# Patient Record
Sex: Male | Born: 1935 | Race: White | Hispanic: No | Marital: Married | Smoking: Former smoker
Health system: Southern US, Community
[De-identification: ages and names within clinical notes are randomized; demographics above are authoritative.]

## PROBLEM LIST (undated history)

## (undated) DIAGNOSIS — J449 Chronic obstructive pulmonary disease, unspecified: Secondary | ICD-10-CM

## (undated) HISTORY — PX: APPENDECTOMY: SHX54

---

## 2015-01-26 ENCOUNTER — Emergency Department (HOSPITAL_BASED_OUTPATIENT_CLINIC_OR_DEPARTMENT_OTHER): Payer: Medicare Other

## 2015-01-26 ENCOUNTER — Encounter (HOSPITAL_BASED_OUTPATIENT_CLINIC_OR_DEPARTMENT_OTHER): Payer: Self-pay | Admitting: Emergency Medicine

## 2015-01-26 DIAGNOSIS — A419 Sepsis, unspecified organism: Principal | ICD-10-CM | POA: Diagnosis present

## 2015-01-26 DIAGNOSIS — J449 Chronic obstructive pulmonary disease, unspecified: Secondary | ICD-10-CM | POA: Diagnosis present

## 2015-01-26 DIAGNOSIS — D751 Secondary polycythemia: Secondary | ICD-10-CM | POA: Diagnosis present

## 2015-01-26 DIAGNOSIS — Z9981 Dependence on supplemental oxygen: Secondary | ICD-10-CM

## 2015-01-26 DIAGNOSIS — Z66 Do not resuscitate: Secondary | ICD-10-CM | POA: Diagnosis present

## 2015-01-26 DIAGNOSIS — E871 Hypo-osmolality and hyponatremia: Secondary | ICD-10-CM | POA: Diagnosis present

## 2015-01-26 DIAGNOSIS — J189 Pneumonia, unspecified organism: Secondary | ICD-10-CM | POA: Diagnosis present

## 2015-01-26 DIAGNOSIS — J9601 Acute respiratory failure with hypoxia: Secondary | ICD-10-CM | POA: Diagnosis present

## 2015-01-26 DIAGNOSIS — R0602 Shortness of breath: Secondary | ICD-10-CM | POA: Diagnosis not present

## 2015-01-26 DIAGNOSIS — F17200 Nicotine dependence, unspecified, uncomplicated: Secondary | ICD-10-CM | POA: Diagnosis present

## 2015-01-26 MED ORDER — ALBUTEROL SULFATE (2.5 MG/3ML) 0.083% IN NEBU
2.5000 mg | INHALATION_SOLUTION | Freq: Once | RESPIRATORY_TRACT | Status: AC
  Administered 2015-01-27: 2.5 mg via RESPIRATORY_TRACT
  Filled 2015-01-26: qty 3

## 2015-01-26 MED ORDER — SODIUM CHLORIDE 0.9 % IV BOLUS (SEPSIS)
1000.0000 mL | Freq: Once | INTRAVENOUS | Status: AC
  Administered 2015-01-27: 1000 mL via INTRAVENOUS

## 2015-01-26 MED ORDER — IPRATROPIUM BROMIDE 0.02 % IN SOLN: 0.5000 mg | Freq: Once | RESPIRATORY_TRACT | Status: DC

## 2015-01-26 MED ORDER — ALBUTEROL SULFATE (2.5 MG/3ML) 0.083% IN NEBU: 5.0000 mg | INHALATION_SOLUTION | Freq: Once | RESPIRATORY_TRACT | Status: DC

## 2015-01-26 MED ORDER — IPRATROPIUM-ALBUTEROL 0.5-2.5 (3) MG/3ML IN SOLN
3.0000 mL | Freq: Once | RESPIRATORY_TRACT | Status: AC
  Administered 2015-01-27: 3 mL via RESPIRATORY_TRACT
  Filled 2015-01-26: qty 3

## 2015-01-26 NOTE — ED Notes (Signed)
Per family member the patient has not been to a Dr in about 50 years. The patient has been sharing O2 with his Wife to make it through to right now to come to the hospital.

## 2015-01-26 NOTE — ED Notes (Signed)
Patient states that he has been SOB x 4 days. Worse with exertion and worse tonight

## 2015-01-27 ENCOUNTER — Inpatient Hospital Stay (HOSPITAL_BASED_OUTPATIENT_CLINIC_OR_DEPARTMENT_OTHER)
Admission: EM | Admit: 2015-01-27 | Discharge: 2015-01-28 | DRG: 871 | Disposition: A | Discharge status: Home / Self Care | Payer: Medicare Other | Attending: Internal Medicine | Admitting: Internal Medicine

## 2015-01-27 ENCOUNTER — Encounter (HOSPITAL_COMMUNITY): Payer: Self-pay | Admitting: Internal Medicine

## 2015-01-27 ENCOUNTER — Inpatient Hospital Stay (HOSPITAL_COMMUNITY): Payer: Medicare Other

## 2015-01-27 DIAGNOSIS — R0600 Dyspnea, unspecified: Secondary | ICD-10-CM

## 2015-01-27 DIAGNOSIS — A419 Sepsis, unspecified organism: Secondary | ICD-10-CM | POA: Diagnosis present

## 2015-01-27 DIAGNOSIS — R0602 Shortness of breath: Secondary | ICD-10-CM | POA: Diagnosis present

## 2015-01-27 DIAGNOSIS — Z9981 Dependence on supplemental oxygen: Secondary | ICD-10-CM | POA: Diagnosis not present

## 2015-01-27 DIAGNOSIS — Z720 Tobacco use: Secondary | ICD-10-CM | POA: Diagnosis present

## 2015-01-27 DIAGNOSIS — J449 Chronic obstructive pulmonary disease, unspecified: Secondary | ICD-10-CM | POA: Diagnosis present

## 2015-01-27 DIAGNOSIS — J189 Pneumonia, unspecified organism: Secondary | ICD-10-CM

## 2015-01-27 DIAGNOSIS — E871 Hypo-osmolality and hyponatremia: Secondary | ICD-10-CM | POA: Diagnosis present

## 2015-01-27 DIAGNOSIS — D751 Secondary polycythemia: Secondary | ICD-10-CM | POA: Diagnosis present

## 2015-01-27 DIAGNOSIS — F17200 Nicotine dependence, unspecified, uncomplicated: Secondary | ICD-10-CM | POA: Diagnosis present

## 2015-01-27 DIAGNOSIS — J439 Emphysema, unspecified: Secondary | ICD-10-CM

## 2015-01-27 DIAGNOSIS — Z66 Do not resuscitate: Secondary | ICD-10-CM | POA: Diagnosis present

## 2015-01-27 DIAGNOSIS — J9601 Acute respiratory failure with hypoxia: Secondary | ICD-10-CM | POA: Diagnosis present

## 2015-01-27 DIAGNOSIS — J438 Other emphysema: Secondary | ICD-10-CM | POA: Diagnosis not present

## 2015-01-27 LAB — COMPREHENSIVE METABOLIC PANEL
ALT: 12 U/L — AB (ref 17–63)
ALT: 12 U/L — ABNORMAL LOW (ref 17–63)
AST: 16 U/L (ref 15–41)
AST: 25 U/L (ref 15–41)
Albumin: 2.6 g/dL — ABNORMAL LOW (ref 3.5–5.0)
Albumin: 3.7 g/dL (ref 3.5–5.0)
Alkaline Phosphatase: 46 U/L (ref 38–126)
Alkaline Phosphatase: 60 U/L (ref 38–126)
Anion gap: 11 (ref 5–15)
Anion gap: 8 (ref 5–15)
BUN: 17 mg/dL (ref 6–20)
BUN: 23 mg/dL — ABNORMAL HIGH (ref 6–20)
CHLORIDE: 95 mmol/L — AB (ref 101–111)
CHLORIDE: 97 mmol/L — AB (ref 101–111)
CO2: 29 mmol/L (ref 22–32)
CO2: 34 mmol/L — AB (ref 22–32)
CREATININE: 0.81 mg/dL (ref 0.61–1.24)
Calcium: 8.9 mg/dL (ref 8.9–10.3)
Calcium: 9.4 mg/dL (ref 8.9–10.3)
Creatinine, Ser: 1.03 mg/dL (ref 0.61–1.24)
Glucose, Bld: 119 mg/dL — ABNORMAL HIGH (ref 65–99)
Glucose, Bld: 151 mg/dL — ABNORMAL HIGH (ref 65–99)
POTASSIUM: 3.6 mmol/L (ref 3.5–5.1)
POTASSIUM: 4.7 mmol/L (ref 3.5–5.1)
SODIUM: 134 mmol/L — AB (ref 135–145)
SODIUM: 140 mmol/L (ref 135–145)
TOTAL PROTEIN: 5.2 g/dL — AB (ref 6.5–8.1)
Total Bilirubin: 0.7 mg/dL (ref 0.3–1.2)
Total Bilirubin: 0.8 mg/dL (ref 0.3–1.2)
Total Protein: 7.5 g/dL (ref 6.5–8.1)

## 2015-01-27 LAB — MRSA PCR SCREENING: MRSA by PCR: NEGATIVE

## 2015-01-27 LAB — INFLUENZA PANEL BY PCR (TYPE A & B)
H1N1FLUPCR: NOT DETECTED
INFLBPCR: NEGATIVE
Influenza A By PCR: NEGATIVE

## 2015-01-27 LAB — CBC WITH DIFFERENTIAL/PLATELET
BASOS ABS: 0 10*3/uL (ref 0.0–0.1)
BASOS ABS: 0 10*3/uL (ref 0.0–0.1)
Basophils Relative: 0 %
Basophils Relative: 0 %
EOS ABS: 0 10*3/uL (ref 0.0–0.7)
EOS ABS: 0 10*3/uL (ref 0.0–0.7)
EOS PCT: 0 %
EOS PCT: 0 %
HCT: 47.9 % (ref 39.0–52.0)
HCT: 57 % — ABNORMAL HIGH (ref 39.0–52.0)
Hemoglobin: 16.2 g/dL (ref 13.0–17.0)
Hemoglobin: 19.2 g/dL — ABNORMAL HIGH (ref 13.0–17.0)
LYMPHS PCT: 7 %
Lymphocytes Relative: 11 %
Lymphs Abs: 0.9 10*3/uL (ref 0.7–4.0)
Lymphs Abs: 1.1 10*3/uL (ref 0.7–4.0)
MCH: 30.9 pg (ref 26.0–34.0)
MCH: 31.6 pg (ref 26.0–34.0)
MCHC: 33.7 g/dL (ref 30.0–36.0)
MCHC: 33.8 g/dL (ref 30.0–36.0)
MCV: 91.6 fL (ref 78.0–100.0)
MCV: 93.4 fL (ref 78.0–100.0)
MONO ABS: 1.5 10*3/uL — AB (ref 0.1–1.0)
MONO ABS: 1.6 10*3/uL — AB (ref 0.1–1.0)
Monocytes Relative: 12 %
Monocytes Relative: 15 %
Neutro Abs: 10.8 10*3/uL — ABNORMAL HIGH (ref 1.7–7.7)
Neutro Abs: 7.9 10*3/uL — ABNORMAL HIGH (ref 1.7–7.7)
Neutrophils Relative %: 74 %
Neutrophils Relative %: 81 %
PLATELETS: 213 10*3/uL (ref 150–400)
PLATELETS: 238 10*3/uL (ref 150–400)
RBC: 5.13 MIL/uL (ref 4.22–5.81)
RBC: 6.22 MIL/uL — ABNORMAL HIGH (ref 4.22–5.81)
RDW: 12.4 % (ref 11.5–15.5)
RDW: 12.6 % (ref 11.5–15.5)
WBC: 10.6 10*3/uL — AB (ref 4.0–10.5)
WBC: 13.3 10*3/uL — AB (ref 4.0–10.5)

## 2015-01-27 LAB — I-STAT CG4 LACTIC ACID, ED
LACTIC ACID, VENOUS: 2.5 mmol/L — AB (ref 0.5–2.0)
LACTIC ACID, VENOUS: 3.46 mmol/L — AB (ref 0.5–2.0)

## 2015-01-27 LAB — HIV ANTIBODY (ROUTINE TESTING W REFLEX): HIV Screen 4th Generation wRfx: NONREACTIVE

## 2015-01-27 LAB — BRAIN NATRIURETIC PEPTIDE: B Natriuretic Peptide: 133.1 pg/mL — ABNORMAL HIGH (ref 0.0–100.0)

## 2015-01-27 LAB — BLOOD GAS, ARTERIAL
Acid-Base Excess: 5.3 mmol/L — ABNORMAL HIGH (ref 0.0–2.0)
Bicarbonate: 30.4 mEq/L — ABNORMAL HIGH (ref 20.0–24.0)
Drawn by: 42624
FIO2: 0.4
O2 Saturation: 91.3 %
PCO2 ART: 54.6 mmHg — AB (ref 35.0–45.0)
PH ART: 7.364 (ref 7.350–7.450)
Patient temperature: 98.6
TCO2: 32.1 mmol/L (ref 0–100)
pO2, Arterial: 63 mmHg — ABNORMAL LOW (ref 80.0–100.0)

## 2015-01-27 LAB — PROTIME-INR
INR: 1.33 (ref 0.00–1.49)
Prothrombin Time: 16.6 seconds — ABNORMAL HIGH (ref 11.6–15.2)

## 2015-01-27 LAB — URINE MICROSCOPIC-ADD ON

## 2015-01-27 LAB — TROPONIN I: TROPONIN I: 0.04 ng/mL — AB (ref <0.031)

## 2015-01-27 LAB — URINALYSIS, ROUTINE W REFLEX MICROSCOPIC
BILIRUBIN URINE: NEGATIVE
Glucose, UA: NEGATIVE mg/dL
KETONES UR: NEGATIVE mg/dL
Leukocytes, UA: NEGATIVE
Nitrite: NEGATIVE
PH: 5.5 (ref 5.0–8.0)
Protein, ur: 100 mg/dL — AB
Specific Gravity, Urine: 1.026 (ref 1.005–1.030)
Urobilinogen, UA: 1 mg/dL (ref 0.0–1.0)

## 2015-01-27 LAB — APTT: aPTT: 40 seconds — ABNORMAL HIGH (ref 24–37)

## 2015-01-27 LAB — PROCALCITONIN: Procalcitonin: 0.22 ng/mL

## 2015-01-27 LAB — LACTIC ACID, PLASMA
LACTIC ACID, VENOUS: 1.2 mmol/L (ref 0.5–2.0)
Lactic Acid, Venous: 1.1 mmol/L (ref 0.5–2.0)

## 2015-01-27 LAB — STREP PNEUMONIAE URINARY ANTIGEN: STREP PNEUMO URINARY ANTIGEN: POSITIVE — AB

## 2015-01-27 MED ORDER — ALBUTEROL SULFATE (2.5 MG/3ML) 0.083% IN NEBU: 2.5000 mg | INHALATION_SOLUTION | RESPIRATORY_TRACT | Status: DC | PRN

## 2015-01-27 MED ORDER — ONDANSETRON HCL 4 MG PO TABS: 4.0000 mg | ORAL_TABLET | Freq: Four times a day (QID) | ORAL | Status: DC | PRN

## 2015-01-27 MED ORDER — ENOXAPARIN SODIUM 40 MG/0.4ML ~~LOC~~ SOLN
40.0000 mg | SUBCUTANEOUS | Status: DC
  Administered 2015-01-27 – 2015-01-28 (×2): 40 mg via SUBCUTANEOUS
  Filled 2015-01-27 (×2): qty 0.4

## 2015-01-27 MED ORDER — ALBUTEROL SULFATE (2.5 MG/3ML) 0.083% IN NEBU: 2.5000 mg | INHALATION_SOLUTION | Freq: Four times a day (QID) | RESPIRATORY_TRACT | Status: DC

## 2015-01-27 MED ORDER — INFLUENZA VAC SPLIT QUAD 0.5 ML IM SUSY
0.5000 mL | PREFILLED_SYRINGE | INTRAMUSCULAR | Status: AC
  Administered 2015-01-28: 0.5 mL via INTRAMUSCULAR

## 2015-01-27 MED ORDER — CETYLPYRIDINIUM CHLORIDE 0.05 % MT LIQD
7.0000 mL | Freq: Two times a day (BID) | OROMUCOSAL | Status: DC
  Administered 2015-01-27 – 2015-01-28 (×2): 7 mL via OROMUCOSAL

## 2015-01-27 MED ORDER — IPRATROPIUM-ALBUTEROL 0.5-2.5 (3) MG/3ML IN SOLN
3.0000 mL | Freq: Four times a day (QID) | RESPIRATORY_TRACT | Status: DC
  Administered 2015-01-27 – 2015-01-28 (×6): 3 mL via RESPIRATORY_TRACT
  Filled 2015-01-27 (×6): qty 3

## 2015-01-27 MED ORDER — SODIUM CHLORIDE 0.9 % IV SOLN
INTRAVENOUS | Status: AC
  Administered 2015-01-27 (×2): via INTRAVENOUS

## 2015-01-27 MED ORDER — IPRATROPIUM BROMIDE 0.02 % IN SOLN: 0.5000 mg | Freq: Four times a day (QID) | RESPIRATORY_TRACT | Status: DC

## 2015-01-27 MED ORDER — AZITHROMYCIN 500 MG IV SOLR
500.0000 mg | Freq: Once | INTRAVENOUS | Status: AC
  Administered 2015-01-27: 500 mg via INTRAVENOUS

## 2015-01-27 MED ORDER — PNEUMOCOCCAL VAC POLYVALENT 25 MCG/0.5ML IJ INJ
0.5000 mL | INJECTION | INTRAMUSCULAR | Status: AC
  Administered 2015-01-28: 0.5 mL via INTRAMUSCULAR
  Filled 2015-01-27: qty 0.5

## 2015-01-27 MED ORDER — DEXTROSE 5 % IV SOLN
500.0000 mg | INTRAVENOUS | Status: DC
  Administered 2015-01-27: 500 mg via INTRAVENOUS
  Filled 2015-01-27 (×2): qty 500

## 2015-01-27 MED ORDER — ACETAMINOPHEN 325 MG PO TABS: 650.0000 mg | ORAL_TABLET | Freq: Four times a day (QID) | ORAL | Status: DC | PRN

## 2015-01-27 MED ORDER — AZITHROMYCIN 500 MG IV SOLR
INTRAVENOUS | Status: AC
  Filled 2015-01-27: qty 500

## 2015-01-27 MED ORDER — ONDANSETRON HCL 4 MG/2ML IJ SOLN: 4.0000 mg | Freq: Four times a day (QID) | INTRAMUSCULAR | Status: DC | PRN

## 2015-01-27 MED ORDER — CEFTRIAXONE SODIUM 1 G IJ SOLR
1.0000 g | Freq: Once | INTRAMUSCULAR | Status: AC
  Administered 2015-01-27: 1 g via INTRAVENOUS

## 2015-01-27 MED ORDER — CEFTRIAXONE SODIUM 1 G IJ SOLR
INTRAMUSCULAR | Status: AC
  Filled 2015-01-27: qty 10

## 2015-01-27 MED ORDER — SODIUM CHLORIDE 0.9 % IV SOLN
Freq: Once | INTRAVENOUS | Status: AC
  Administered 2015-01-27: 03:00:00 via INTRAVENOUS

## 2015-01-27 MED ORDER — DEXTROSE 5 % IV SOLN: 500.0000 mg | INTRAVENOUS | Status: DC

## 2015-01-27 MED ORDER — DEXTROSE 5 % IV SOLN: 1.0000 g | INTRAVENOUS | Status: DC

## 2015-01-27 MED ORDER — DEXTROSE 5 % IV SOLN
1.0000 g | INTRAVENOUS | Status: DC
  Administered 2015-01-27: 1 g via INTRAVENOUS
  Filled 2015-01-27 (×2): qty 10

## 2015-01-27 MED ORDER — ENSURE ENLIVE PO LIQD
237.0000 mL | Freq: Two times a day (BID) | ORAL | Status: DC
  Administered 2015-01-27: 237 mL via ORAL

## 2015-01-27 MED ORDER — ACETAMINOPHEN 650 MG RE SUPP: 650.0000 mg | Freq: Four times a day (QID) | RECTAL | Status: DC | PRN

## 2015-01-27 MED ORDER — SODIUM CHLORIDE 0.9 % IV BOLUS (SEPSIS)
1000.0000 mL | INTRAVENOUS | Status: DC
  Administered 2015-01-27: 1000 mL via INTRAVENOUS

## 2015-01-27 NOTE — Progress Notes (Signed)
[facility] Progress Note   Jeremy Brewer  ZOX:[id number]  DOB: 1930-08-15  DOA: 01/27/2015 PCP: No primary care provider on file.  Brief narrative: Jeremy Brewer is a 81 y.o. male who is a smoker and is on oxygen at home. He states that he does not see a primary care physician in many years ("does not believe in doctors") and also tells me that he has been on oxygen for a number of years. He presents to the hospital for increased shortness of breath over the past 3-4 days.   Subjective: Minutes to a cough which is worse than usual and productive of white sputum. Is not short of breath while at rest sitting up in the bed. No complaint of fever or chills or chest pain. No nausea vomiting or dysuria.   Assessment/Plan: Principal Problem:   Acute respiratory failure with hypoxia  -likely secondary to COPD however, acute bronchitis cannot be completely excluded -chest x-ray reveals hyperinflation, emphysematous changes (fibrosis) but no clear-cut infiltrate -Influenza panel is negative -Exam reveals coarse right lower lobe crackles and strep pneumoniae urinary antigen is positive - treatment for lobar pneumonia has been started with Rocephin and Zithromax -He is still quite hypoxic and requiring a Venturi mask to keep his oxygen saturation at 90% -Continue nebulizer treatments-no indication for steroids at this time -Team to follow in the step down unit and wean oxygen as able  Active Problems: Sepsis  -Secondary to above  Hyponatremia -Possibly related to dehydration-continue slow IV fluids  Elevated troponin -Likely secondary to acute respiratory failure will continue to follow-she has no complaints of chest pain  COPD -Currently without exacerbation-he states that he is on oxygen at home but is unable to tell me how many liters he requires  Polycythemia -Likely secondary to above  Tobacco abuse -Have counseled him to discontinue    Code Status:     Code Status  Orders        Start     Ordered   01/27/15 0526  Do not attempt resuscitation (DNR)   Continuous    Question Answer Comment  In the event of cardiac or respiratory ARREST Do not call a "code blue"   In the event of cardiac or respiratory ARREST Do not perform Intubation, CPR, defibrillation or ACLS   In the event of cardiac or respiratory ARREST Use medication by any route, position, wound care, and other measures to relive pain and suffering. May use oxygen, suction and manual treatment of airway obstruction as needed for comfort.      01/27/15 0526     Family Communication:  Disposition Plan: follow in step down unit DVT prophylaxis: Lovenox Consultants: Procedures:  Antibiotics: Anti-infectives    Start     Dose/Rate Route Frequency Ordered Stop   01/27/15 2200  azithromycin (ZITHROMAX) 500 mg in dextrose 5 % 250 mL IVPB     500 mg 250 mL/hr over 60 Minutes Intravenous Every 24 hours 01/27/15 0348 02/02/15 2159   01/27/15 2200  cefTRIAXone (ROCEPHIN) 1 g in dextrose 5 % 50 mL IVPB     1 g 100 mL/hr over 30 Minutes Intravenous Every 24 hours 01/27/15 0348 02/02/15 2159   01/27/15 0530  cefTRIAXone (ROCEPHIN) 1 g in dextrose 5 % 50 mL IVPB  Status:  Discontinued     1 g 100 mL/hr over 30 Minutes Intravenous Every 24 hours 01/27/15 0526 01/27/15 0530   01/27/15 0530  azithromycin (ZITHROMAX) 500 mg in dextrose 5 % 250 mL IVPB  Status:  Discontinued     500 mg 250 mL/hr over 60 Minutes Intravenous Every 24 hours 01/27/15 0526 01/27/15 0530   01/27/15 0102  azithromycin (ZITHROMAX) 500 MG injection    Comments:  Corliss Parish   : cabinet override      01/27/15 0102 01/27/15 0246   01/27/15 0102  cefTRIAXone (ROCEPHIN) 1 G injection    Comments:  Corliss Parish   : cabinet override      01/27/15 0102 01/27/15 0246   01/27/15 0100  cefTRIAXone (ROCEPHIN) 1 g in dextrose 5 % 50 mL IVPB     1 g 100 mL/hr over 30 Minutes Intravenous  Once 01/27/15 0050 01/27/15 0140   01/27/15 0100   azithromycin (ZITHROMAX) 500 mg in dextrose 5 % 250 mL IVPB     500 mg 250 mL/hr over 60 Minutes Intravenous  Once 01/27/15 0050 01/27/15 0221      Objective: Filed Weights   01/26/15 2336 01/27/15 0301  Weight: 54.432 kg (120 lb) 57.5 kg (126 lb 12.2 oz)    Intake/Output Summary (Last 24 hours) at 01/27/15 1309 Last data filed at 01/27/15 1241  Gross per 24 hour  Intake    375 ml  Output    150 ml  Net    225 ml     Vitals Filed Vitals:   01/27/15 0844 01/27/15 0852 01/27/15 0900 01/27/15 1241  BP: 154/66 154/66 173/69 150/53  Pulse: 63 67 96 87  Temp:  98.1 F (36.7 C)  98.1 F (36.7 C)  TempSrc:  Oral  Oral  Resp: 12 21 29  35  Height:      Weight:      SpO2: 92% 93% 85% 90%    Exam:  General:  Pt is alert, not in acute distress  HEENT: No icterus, No thrush, oral mucosa moist  Cardiovascular: regular rate and rhythm, S1/S2 No murmur  Respiratory: Coarse crackles in left lower lobe-no wheezing or rhonchi  Abdomen: Soft, +Bowel sounds, non tender, non distended, no guarding  MSK: No LE edema, cyanosis or clubbing  Data Reviewed: Basic Metabolic Panel:  Recent Labs Lab 01/27/15 0005 01/27/15 0550  NA 140 134*  K 4.7 3.6  CL 95* 97*  CO2 34* 29  GLUCOSE 151* 119*  BUN 23* 17  CREATININE 1.03 0.81  CALCIUM 9.4 8.9   Liver Function Tests:  Recent Labs Lab 01/27/15 0005 01/27/15 0550  AST 25 16  ALT 12* 12*  ALKPHOS 60 46  BILITOT 0.7 0.8  PROT 7.5 5.2*  ALBUMIN 3.7 2.6*   No results for input(s): LIPASE, AMYLASE in the last 168 hours. No results for input(s): AMMONIA in the last 168 hours. CBC:  Recent Labs Lab 01/27/15 0005 01/27/15 0550  WBC 13.3* 10.6*  NEUTROABS 10.8* 7.9*  HGB 19.2* 16.2  HCT 57.0* 47.9  MCV 91.6 93.4  PLT 238 213   Cardiac Enzymes:  Recent Labs Lab 01/27/15 0550  TROPONINI 0.04*   BNP (last 3 results)  Recent Labs  01/27/15 0005  BNP 133.1*    ProBNP (last 3 results) No results for  input(s): PROBNP in the last 8760 hours.  CBG: No results for input(s): GLUCAP in the last 168 hours.  Recent Results (from the past 240 hour(s))  MRSA PCR Screening     Status: None   Collection Time: 01/27/15  3:05 AM  Result Value Ref Range Status   MRSA by PCR NEGATIVE NEGATIVE Final    Comment:  The GeneXpert MRSA Assay (FDA approved for NASAL specimens only), is one component of a comprehensive MRSA colonization surveillance program. It is not intended to diagnose MRSA infection nor to guide or monitor treatment for MRSA infections.      Studies: Dg Chest Port 1 View  01/27/2015  CLINICAL DATA:  Dyspnea, 4 days duration. EXAM: PORTABLE CHEST 1 VIEW COMPARISON:  None. FINDINGS: There is hyperinflation. Extensive emphysematous and fibrotic appearing changes are present. No confluent airspace consolidation. No large effusion. No pneumothorax. Hilar and mediastinal contours are unremarkable. IMPRESSION: Hyperinflation with emphysematous changes. The extensive interstitial coarsening is probably fibrotic, but a component of reversible interstitial infiltrate or edema cannot be excluded. No confluent consolidation or large effusion. Electronically Signed   By: Ellery Plunk M.D.   On: 01/27/2015 00:42    Scheduled Meds:  Scheduled Meds: . antiseptic oral rinse  7 mL Mouth Rinse BID  . azithromycin  500 mg Intravenous Q24H  . cefTRIAXone (ROCEPHIN)  IV  1 g Intravenous Q24H  . enoxaparin (LOVENOX) injection  40 mg Subcutaneous Q24H  . feeding supplement (ENSURE ENLIVE)  237 mL Oral BID BM  . [START ON 01/28/2015] Influenza vac split quadrivalent PF  0.5 mL Intramuscular Tomorrow-1000  . ipratropium-albuterol  3 mL Nebulization Q6H  . [START ON 01/28/2015] pneumococcal 23 valent vaccine  0.5 mL Intramuscular Tomorrow-1000   Continuous Infusions: . sodium chloride 125 mL/hr at 01/27/15 0609    Time spent on care of this patient: 35 min   Zyden Suman,  MD 01/27/2015, 1:09 PM  LOS: 0 days   [facility] Office  [phone] Pager - Text Page per [email or web address] If 7PM-7AM, please contact night-coverage [email or web address]

## 2015-01-27 NOTE — Progress Notes (Signed)
Initial Nutrition Assessment  DOCUMENTATION CODES:   Severe malnutrition in context of chronic illness  INTERVENTION:   Ensure Enlive po BID, each supplement provides 350 kcal and 20 grams of protein    NUTRITION DIAGNOSIS:   Malnutrition related to inability to eat as evidenced by severe depletion of body fat, severe depletion of muscle mass.  GOAL:   Patient will meet greater than or equal to 90% of their needs  MONITOR:   PO intake, Supplement acceptance, I & O's, Labs  REASON FOR ASSESSMENT:   Malnutrition Screening Tool    ASSESSMENT:   Jeremy Brewer is a 81 y.o. male with no significant past medical history presents to the ER because of worsening shortness of breath over the last 3-4 days. Patient also had been having subjective feeling of fever chills and productive cough. Patient's shortness of breath increases on walking. Denies any chest pain. In the ER patient was found to be mildly hypotensive and lactic acid elevated. Patient was given empiric antibiotics after blood cultures for pneumonia. Patient has been admitted for sepsis secondary to pneumonia. Patient otherwise denies any chest pain nausea vomiting abdominal pain or diarrhea. Patient has not been to a physician for many years.   Was unable to wake patient during time of visit. Pt has no PO intake on file, when RD visited, pt had not touched lunch tray brought to him. Ensure had a straw in it but was still full.  Pt exhibits severe malnutrition based upon severe muscle wasting and severe fat depletion.  Per chart review, pt reports weight loss, was unable to confirm at this time.  Likely cause of wt loss/malnutrition is shortness of breath related to COPD and Pneumonia   Diet Order:  Diet regular Room service appropriate?: Yes; Fluid consistency:: Thin  Skin:  Reviewed, no issues  Last BM:  01/26/2015  Height:   Ht Readings from Last 1 Encounters:  01/27/15 5\' 7"  (1.702 m)    Weight:   Wt  Readings from Last 1 Encounters:  01/27/15 126 lb 12.2 oz (57.5 kg)    Ideal Body Weight:  67.27 kg  BMI:  Body mass index is 19.85 kg/(m^2).  Estimated Nutritional Needs:   Kcal:  1700-2000  Protein:  60-75 grams  Fluid:  >/= 1.7L  EDUCATION NEEDS:   No education needs identified at this time  Dionne Ano. Penney Domanski, MS, RD LDN After Hours/Weekend Pager [phone]

## 2015-01-27 NOTE — H&P (Signed)
[facility] History and Physical  Jeremy Brewer WJX:[id number] DOB: June 30, 1930 DOA: 01/27/2015  Referring physician: Dr. Anitra Lauth. Patient was transferred from [facility] [place]. PCP: No primary care provider on file. patient has not been following with any PCP. Specialists: None.  Chief Complaint: Shortness of breath.  HPI: Jeremy Brewer is a 81 y.o. male with no significant past medical history presents to the ER because of worsening shortness of breath over the last 3-4 days. Patient also had been having subjective feeling of fever chills and productive cough. Patient's shortness of breath increases on walking. Denies any chest pain. In the ER patient was found to be mildly hypotensive and lactic acid elevated. Patient was given empiric antibiotics after blood cultures for pneumonia. Patient has been admitted for sepsis secondary to pneumonia. Patient otherwise denies any chest pain nausea vomiting abdominal pain or diarrhea. Patient has not been to a physician for many years.   Review of Systems: As presented in the history of presenting illness, rest negative.  Past Medical History  Diagnosis Date  . Medical history non-contributory    Past Surgical History  Procedure Laterality Date  . Appendectomy     Social History:  reports that he has been smoking.  He does not have any smokeless tobacco history on file. He reports that he does not drink alcohol. His drug history is not on file. Where does patient live home. Can patient participate in ADLs? Yes.  No Known Allergies  Family History:  Family History  Problem Relation Age of Onset  . Diabetes Mellitus II Neg Hx   . CAD Neg Hx       Prior to Admission medications   Not on File    Physical Exam: Filed Vitals:   01/27/15 0153 01/27/15 0200 01/27/15 0301 01/27/15 0318  BP: 129/58 129/62  131/63  Pulse: 89 85  76  Temp:  98.2 F (36.8 C) 97.4 F (36.3 C)   TempSrc:   Axillary   Resp: Height:   5'  7" (1.702 m)   Weight:   57.5 kg (126 lb 12.2 oz)   SpO2: 91% 89%  92%     General:  Moderately built and poorly nourished.  Eyes: Anicteric no pallor.  ENT: No discharge from the ears eyes nose and mouth.  Neck: No mass felt. No JVD appreciated.  Cardiovascular: S1-S2 heard.  Respiratory: No rhonchi or crepitations.   Abdomen: Soft nontender bowel sounds present.  Skin: No rash.  Musculoskeletal: No edema.  Psychiatric: Appears normal.  Neurologic: Alert awake oriented to time place and person. Moves all extremity.  Labs on Admission:  Basic Metabolic Panel:  Recent Labs Lab 01/27/15 0005  NA 140  K 4.7  CL 95*  CO2 34*  GLUCOSE 151*  BUN 23*  CREATININE 1.03  CALCIUM 9.4   Liver Function Tests:  Recent Labs Lab 01/27/15 0005  AST 25  ALT 12*  ALKPHOS 60  BILITOT 0.7  PROT 7.5  ALBUMIN 3.7   No results for input(s): LIPASE, AMYLASE in the last 168 hours. No results for input(s): AMMONIA in the last 168 hours. CBC:  Recent Labs Lab 01/27/15 0005  WBC 13.3*  NEUTROABS 10.8*  HGB 19.2*  HCT 57.0*  MCV 91.6  PLT 238   Cardiac Enzymes: No results for input(s): CKTOTAL, CKMB, CKMBINDEX, TROPONINI in the last 168 hours.  BNP (last 3 results)  Recent Labs  01/27/15 0005  BNP 133.1*    ProBNP (  last 3 results) No results for input(s): PROBNP in the last 8760 hours.  CBG: No results for input(s): GLUCAP in the last 168 hours.  Radiological Exams on Admission: Dg Chest Port 1 View  01/27/2015  CLINICAL DATA:  Dyspnea, 4 days duration. EXAM: PORTABLE CHEST 1 VIEW COMPARISON:  None. FINDINGS: There is hyperinflation. Extensive emphysematous and fibrotic appearing changes are present. No confluent airspace consolidation. No large effusion. No pneumothorax. Hilar and mediastinal contours are unremarkable. IMPRESSION: Hyperinflation with emphysematous changes. The extensive interstitial coarsening is probably fibrotic, but a component of  reversible interstitial infiltrate or edema cannot be excluded. No confluent consolidation or large effusion. Electronically Signed   By: Ellery Plunk M.D.   On: 01/27/2015 00:42    EKG: Independently reviewed. Normal sinus rhythm with first-degree AV block.  Assessment/Plan Principal Problem:   Acute respiratory failure with hypoxia (HCC) Active Problems:   Sepsis (HCC)   Community acquired pneumonia   COPD (chronic obstructive pulmonary disease) (HCC)   Polycythemia   Tobacco abuse   1. Acute respiratory failure with hypoxia - probably second to pneumonia and COPD. Patient has been tried on ceftriaxone and Zithromax for community-acquired pneumonia. Check urine Legionella and strep antigen and HIV status and influenza PCR. Patient has been also placed on nebulizer for COPD. Since patient has exertional symptoms I have ordered a 2-D echo. 2. Sepsis secondary to pneumonia - patient has been placed on sepsis protocol continue with aggressive hydration repeat lactic acid and check pro-calcitonin levels. Patient is on empiric antibiotics for adequate pneumonia. Follow blood cultures. 3. Polycythemia - already secondary to COPD. Follow CBC. 4. Hyperglycemia - check hemoglobin A1c. 5. Tobacco abuse - advised to quit smoking.  I have personally reviewed patient's chest x-ray and EKG.   DVT Prophylaxis Lovenox.  Code Status: DO NOT RESUSCITATE.  Family Communication: Discussed with patient.  Disposition Plan: Admit to inpatient.    Caelie Remsburg N. [facility] Pager [phone].  If 7PM-7AM, please contact night-coverage [email or web address] Password [facility] 01/27/2015, 5:28 AM

## 2015-01-27 NOTE — ED Notes (Addendum)
Pt transferred to [facility] via [facility] prior to last fluid infusion starting

## 2015-01-27 NOTE — ED Provider Notes (Addendum)
CSN: [id number]     Arrival date & time 01/26/15  2328 History   First MD Initiated Contact with Patient 01/26/15 2358     Chief Complaint  Patient presents with  . Shortness of Breath     (Consider location/radiation/quality/duration/timing/severity/associated sxs/prior Treatment) HPI Comments: patient is an 81 year old male with no medical history because he has not seen a doctor in the last 50 years who presents today with a four-day history of fever, chills, shortness of breath and productive cough. He has smoked for years and always has a cough however now he is coughing up thick green foul-smelling sputum. He also has had a lack of appetite but denies any vomiting. No mental status changes per family. They state he was very resistant to come to the doctor but finally today he was having so much trouble breathing they forced him to come. Patient's wife had oxygen which he had been using at home to help make it through but he was not getting better. He has had no recent hospitalizations takes no medications and uses no inhalers.    Patient is a 81 y.o. male presenting with shortness of breath. The history is provided by the patient and a relative.  Shortness of Breath Severity:  Severe Onset quality:  Gradual Duration:  4 days Timing:  Constant Progression:  Worsening Chronicity:  New Context: URI   Associated symptoms: cough and fever   Associated symptoms: no abdominal pain, no chest pain and no vomiting     History reviewed. No pertinent past medical history. History reviewed. No pertinent past surgical history. History reviewed. No pertinent family history. Social History  Substance Use Topics  . Smoking status: Heavy Tobacco Smoker  . Smokeless tobacco: None  . Alcohol Use: None    Review of Systems  Constitutional: Positive for fever, appetite change and fatigue.  Respiratory: Positive for cough and shortness of breath.   Cardiovascular: Negative for chest pain and  leg swelling.  Gastrointestinal: Negative for nausea, vomiting and abdominal pain.  All other systems reviewed and are negative.     Allergies  Review of patient's allergies indicates no known allergies.  Home Medications   Prior to Admission medications   Not on File   BP 100/81 mmHg  Pulse 92  Temp(Src) 97.8 F (36.6 C) (Oral)  Resp 15  Wt 120 lb (54.432 kg)  SpO2 94% Physical Exam  Constitutional: He is oriented to person, place, and time. He appears well-developed. He appears cachectic. He appears distressed.  Appears SOB  HENT:  Head: Normocephalic and atraumatic.  Mouth/Throat: Oropharynx is clear and moist. Mucous membranes are dry.  Eyes: Conjunctivae and EOM are normal. Pupils are equal, round, and reactive to light.  Neck: Normal range of motion. Neck supple.  Cardiovascular: Regular rhythm and intact distal pulses.  Tachycardia present.   No murmur heard. Pulmonary/Chest: Accessory muscle usage present. Tachypnea noted. No respiratory distress. He has decreased breath sounds. He has wheezes. He has no rales.  Abdominal: Soft. He exhibits no distension. There is no tenderness. There is no rebound and no guarding.  Musculoskeletal: Normal range of motion. He exhibits no edema or tenderness.  Neurological: He is alert and oriented to person, place, and time.  Skin: Skin is warm and dry. Rash noted. No erythema.  Areas of eczema over the chest, neck and upper ext  Psychiatric: He has a normal mood and affect. His behavior is normal.  Nursing note and vitals reviewed.   ED Course  Procedures (including critical care time) Labs Review Labs Reviewed  CBC WITH DIFFERENTIAL/PLATELET - Abnormal; Notable for the following:    WBC 13.3 (*)    RBC 6.22 (*)    Hemoglobin 19.2 (*)    HCT 57.0 (*)    Neutro Abs 10.8 (*)    Monocytes Absolute 1.6 (*)    All other components within normal limits  COMPREHENSIVE METABOLIC PANEL - Abnormal; Notable for the following:     Chloride 95 (*)    CO2 34 (*)    Glucose, Bld 151 (*)    BUN 23 (*)    ALT 12 (*)    All other components within normal limits  BRAIN NATRIURETIC PEPTIDE - Abnormal; Notable for the following:    B Natriuretic Peptide 133.1 (*)    All other components within normal limits  I-STAT CG4 LACTIC ACID, ED - Abnormal; Notable for the following:    Lactic Acid, Venous 3.46 (*)    All other components within normal limits  CULTURE, BLOOD (ROUTINE X 2)  CULTURE, BLOOD (ROUTINE X 2)  URINE CULTURE  URINALYSIS, ROUTINE W REFLEX MICROSCOPIC (NOT AT [facility])  I-STAT CG4 LACTIC ACID, ED  I-STAT CG4 LACTIC ACID, ED    Imaging Review Dg Chest Port 1 View  01/27/2015  CLINICAL DATA:  Dyspnea, 4 days duration. EXAM: PORTABLE CHEST 1 VIEW COMPARISON:  None. FINDINGS: There is hyperinflation. Extensive emphysematous and fibrotic appearing changes are present. No confluent airspace consolidation. No large effusion. No pneumothorax. Hilar and mediastinal contours are unremarkable. IMPRESSION: Hyperinflation with emphysematous changes. The extensive interstitial coarsening is probably fibrotic, but a component of reversible interstitial infiltrate or edema cannot be excluded. No confluent consolidation or large effusion. Electronically Signed   By: Ellery Plunk M.D.   On: 01/27/2015 00:42   I have personally reviewed and evaluated these images and lab results as part of my medical decision-making.   EKG Interpretation   Date/Time:  Tuesday January 26 2015 23:56:56 EDT Ventricular Rate:  94 PR Interval:  216 QRS Duration: 106 QT Interval:  360 QTC Calculation: 450 R Axis:   111 Text Interpretation:  Sinus rhythm with 1st degree A-V block Inferior  infarct , age undetermined Possible Anterolateral infarct , age  undetermined No previous tracing Confirmed by Anitra Lauth  MD, Aloysuis Ribaudo  [phone]) on 01/27/2015 12:30:32 AM      MDM   Final diagnoses:  Community acquired pneumonia  Sepsis, due to  unspecified organism [facility])    Patient is an 81 year old male with no known medical history because he has not seen a doctor in 50 years who presents today with concern for pneumonia. He was hypoxic to 85% on 4 L of nasal cannula oxygen upon arrival with accessory muscle use and wheezing. He had been having URI symptoms with fever for the last 4 days which are worsening. He smokes heavily but denies any alcohol use. He denies any chest pain or distal edema. No prior heart problems.  Code sepsis protocol initiated. Patient given albuterol and Atrovent with significant improvement in his shortness of breath. He is on 6 L and then toward a mask satting low 90s. He is awake alert and able to speak in full sentences at this time. He was started on Rocephin and azithromycin for community-acquired pneumonia.  Lactic acid is 3.46 with a leukocytosis of 13,000 and hemoglobin of 19. CMP with elevated CO2 of 34. BNP within normal limits. EKG without acute findings. Chest x-ray with concern for a reversible  interstitial infiltrate.  Patient will be admitted for further care.  CRITICAL CARE Performed by: Gwyneth Sprout Total critical care time: 30 minutes Critical care time was exclusive of separately billable procedures and treating other patients. Critical care was necessary to treat or prevent imminent or life-threatening deterioration. Critical care was time spent personally by me on the following activities: development of treatment plan with patient and/or surrogate as well as nursing, discussions with consultants, evaluation of patient's response to treatment, examination of patient, obtaining history from patient or surrogate, ordering and performing treatments and interventions, ordering and review of laboratory studies, ordering and review of radiographic studies, pulse oximetry and re-evaluation of patient's condition.   Gwyneth Sprout, MD 01/27/15 5366  Gwyneth Sprout, MD 01/27/15 4403

## 2015-01-27 NOTE — Progress Notes (Signed)
ANTIBIOTIC CONSULT NOTE - INITIAL  Pharmacy Consult for Ceftriaxone and Azithromycin Indication: CAP  No Known Allergies  Patient Measurements: Height: 5\' 7"  (170.2 cm) Weight: 126 lb 12.2 oz (57.5 kg) IBW/kg (Calculated) : 66.1  Vital Signs: Temp: 97.4 F (36.3 C) (11/02 0301) Temp Source: Axillary (11/02 0301) BP: 131/63 mmHg (11/02 0318) Pulse Rate: 76 (11/02 0318) Intake/Output from previous day:   Intake/Output from this shift:    Labs:  Recent Labs  01/27/15 0005  WBC 13.3*  HGB 19.2*  PLT 238  CREATININE 1.03   Estimated Creatinine Clearance: 45.7 mL/min (by C-G formula based on Cr of 1.03). No results for input(s): VANCOTROUGH, VANCOPEAK, VANCORANDOM, GENTTROUGH, GENTPEAK, GENTRANDOM, TOBRATROUGH, TOBRAPEAK, TOBRARND, AMIKACINPEAK, AMIKACINTROU, AMIKACIN in the last 72 hours.   Microbiology: No results found for this or any previous visit (from the past 720 hour(s)).  Medical History: History reviewed. No pertinent past medical history.  Medications:  No home medications  Assessment: 81 y.o. male presents to [facility] with. Pt received Azithromycin 500mg  IV and Rocephin 1gm IV ~0100. To continue abx for CAP.  Goal of Therapy:  Resolution of infection  Plan:  Azithromycin 500mg  IV q24h Ceftriaxone 1gm IV q24h Pharmacy will sign off - please reconsult if needed  Christoper Fabian, PharmD, BCPS Clinical pharmacist, pager [phone] 01/27/2015,3:45 AM

## 2015-01-27 NOTE — Progress Notes (Signed)
Utilization Review Completed.  

## 2015-01-27 NOTE — ED Notes (Addendum)
Attempted report 3 times, no answer from floor

## 2015-01-28 ENCOUNTER — Inpatient Hospital Stay (HOSPITAL_COMMUNITY): Payer: Medicare Other

## 2015-01-28 DIAGNOSIS — J438 Other emphysema: Secondary | ICD-10-CM

## 2015-01-28 DIAGNOSIS — A419 Sepsis, unspecified organism: Secondary | ICD-10-CM | POA: Diagnosis not present

## 2015-01-28 DIAGNOSIS — J189 Pneumonia, unspecified organism: Secondary | ICD-10-CM

## 2015-01-28 DIAGNOSIS — D751 Secondary polycythemia: Secondary | ICD-10-CM

## 2015-01-28 DIAGNOSIS — Z720 Tobacco use: Secondary | ICD-10-CM

## 2015-01-28 LAB — LEGIONELLA PNEUMOPHILA SEROGP 1 UR AG: L. pneumophila Serogp 1 Ur Ag: NEGATIVE

## 2015-01-28 LAB — BASIC METABOLIC PANEL
Anion gap: 8 (ref 5–15)
BUN: 12 mg/dL (ref 6–20)
CALCIUM: 8.9 mg/dL (ref 8.9–10.3)
CO2: 32 mmol/L (ref 22–32)
CREATININE: 0.76 mg/dL (ref 0.61–1.24)
Chloride: 103 mmol/L (ref 101–111)
GFR calc non Af Amer: >60 mL/min (ref >60)
Glucose, Bld: 94 mg/dL (ref 65–99)
Potassium: 4.1 mmol/L (ref 3.5–5.1)
SODIUM: 143 mmol/L (ref 135–145)

## 2015-01-28 LAB — CBC
HCT: 47.4 % (ref 39.0–52.0)
Hemoglobin: 15.7 g/dL (ref 13.0–17.0)
MCH: 31.3 pg (ref 26.0–34.0)
MCHC: 33.1 g/dL (ref 30.0–36.0)
MCV: 94.6 fL (ref 78.0–100.0)
PLATELETS: 253 10*3/uL (ref 150–400)
RBC: 5.01 MIL/uL (ref 4.22–5.81)
RDW: 12.7 % (ref 11.5–15.5)
WBC: 10.2 10*3/uL (ref 4.0–10.5)

## 2015-01-28 LAB — HEMOGLOBIN A1C
HEMOGLOBIN A1C: 5.7 % — AB (ref 4.8–5.6)
MEAN PLASMA GLUCOSE: 117 mg/dL

## 2015-01-28 LAB — URINE CULTURE: CULTURE: NO GROWTH

## 2015-01-28 LAB — TROPONIN I

## 2015-01-28 MED ORDER — TIOTROPIUM BROMIDE MONOHYDRATE 18 MCG IN CAPS: 18.0000 ug | ORAL_CAPSULE | Freq: Every day | RESPIRATORY_TRACT | Status: DC

## 2015-01-28 MED ORDER — FLUTICASONE-SALMETEROL 250-50 MCG/DOSE IN AEPB: 1.0000 | INHALATION_SPRAY | Freq: Two times a day (BID) | RESPIRATORY_TRACT | Status: DC

## 2015-01-28 MED ORDER — ALBUTEROL SULFATE HFA 108 (90 BASE) MCG/ACT IN AERS: 2.0000 | INHALATION_SPRAY | Freq: Four times a day (QID) | RESPIRATORY_TRACT | Status: DC | PRN

## 2015-01-28 MED ORDER — LEVOFLOXACIN 750 MG PO TABS: 750.0000 mg | ORAL_TABLET | Freq: Every day | ORAL | Status: DC

## 2015-01-28 NOTE — Progress Notes (Signed)
SATURATION QUALIFICATIONS: (This note is used to comply with regulatory documentation for home oxygen)  Patient Saturations on Room Air at Rest = 87%  Pt saturation on 2L of 02 was 92%    Please briefly explain why patient needs home oxygen:Hypoxia

## 2015-01-28 NOTE — Care Management Note (Signed)
Case Management Note  Patient Details  Name: Jeremy Brewer MRN: [id number] Date of Birth: May 18, 1930  Subjective/Objective:   Pt lives with spouse, states he has oxygen @ home and wife uses it periodically.  Also states he has no PCP and requests name of provider in [place].  TC to [facility] and determined Dr Betty [place] is accepting new patients.  TC to [facility] - provider is in network.  TC to spouse who states the home oxygen is hers and pt has never been on oxygen.  Discussed PCP for pt - she is familiar with group and will attempt to arrange appt for pt.  RN will ambulate pt to determine need for home O2.                            Expected Discharge Plan:  Home/Self Care  Discharge planning Services  CM Consult  Status of Service:  In process, will continue to follow  Magdalene River, RN 01/28/2015, 10:58 AM

## 2015-01-28 NOTE — Care Management Note (Signed)
Case Management Note  Patient Details  Name: Jeremy Brewer MRN: [id number] Date of Birth: 27-Apr-1930  Subjective/Objective:    Pt will need home O2 on discharge.  Wife reports her O2 is through [facility] and requests we order pt's through the same agency.  Wife also states pt has been "contrary" about establishing care with a physician but is hopeful that he will agree since MD is in [facility] and close to their home.                 Expected Discharge Plan:  Home/Self Care  Discharge planning Services  CM Consult   Post Acute Care Choice:  Durable Medical Equipment   DME Arranged:  Oxygen DME Agency:  [facility]  Status of Service:  Completed, signed off  Magdalene River, [place] 01/28/2015, 2:01 PM

## 2015-01-28 NOTE — Discharge Summary (Addendum)
Physician Discharge Summary  Townes Fuhs RUE:[id number] DOB: 1930/09/08 DOA: 01/27/2015  PCP: No primary care provider on file. He s attempting to find a PCP in [place]  Admit date: 01/27/2015 Discharge date: 01/28/2015  Time spent: 60 minutes  Recommendations for Outpatient Follow-up:  1. Needs baseline PFTs  Discharge Condition: stable  Discharge Diagnoses:  Principal Problem:   Acute respiratory failure with hypoxia (HCC) Active Problems:   Sepsis (HCC)   Community acquired pneumonia   COPD (chronic obstructive pulmonary disease) (HCC)   Polycythemia   Tobacco abuse   History of present illness:  Jeremy Brewer is a 81 y.o. male who is a smoker. He states that he does not see a primary care physician in many years ("does not believe in doctors") and initially told me that he has been on oxygen for a number of years but I later discovered that he has been using his wifes O2. He presents to the hospital for increased shortness of breath over the past 3-4 days.  Hospital Course:  Principal Problem:  Acute respiratory failure with hypoxia  -likely secondary to severe COPD and community acquired pneumonia -chest x-ray reveals hyperinflation, emphysematous changes (fibrosis) but no clear-cut infiltrate- However, exam reveals coarse left lower lobe crackles- strep pneumoniae urinary antigen is positive - treatment for lobar pneumonia has been started with Rocephin and Zithromax- will be transitioned to Levaquin on discharge to complete a 7 day course -Influenza panel is negative - he was initially requiring a Venti mask with an FiO2 of 50 % to maintain O2 saturations in mid 90s - now has been weaned down to 2 L of O2 at rest  Active Problems:  Sepsis  -Secondary community acquired pneumonia   COPD  - severe COPD at baseline- did not have wheezing and therefore was not started on IV steroids- his respiratory status has improved without steroids - will discharge with Advair, Spiriva  and Albuterol inhaler (new prescriptions) -Have prescribed O2 on discharge - recommend PFTs as outpt  Hyponatremia -Possibly related to dehydration- resolved with IV fluids  Elevated troponin -one mildly elevated troponin noted on day of admission- subsequent 3 troponin negative- he has had no complaints of chest pain-   Polycythemia? - Hb noted to be elevated on admission however, has improved with IV hydration and therefore was likely secondary to hemoconcenteration (see labs below)  Tobacco abuse -Have counseled him to discontinue   Discharge Exam: Filed Weights   01/26/15 2336 01/27/15 0301  Weight: 54.432 kg (120 lb) 57.5 kg (126 lb 12.2 oz)   Filed Vitals:   01/28/15 1000  BP: 162/61  Pulse: 59  Temp:   Resp: 23    General: AAO x 3, no distress Cardiovascular: RRR, no murmurs  Respiratory: mild crackles in LLL GI: soft, non-tender, non-distended, bowel sound positive  Discharge Instructions You were cared for by a hospitalist during your hospital stay. If you have any questions about your discharge medications or the care you received while you were in the hospital after you are discharged, you can call the unit and asked to speak with the hospitalist on call if the hospitalist that took care of you is not available. Once you are discharged, your primary care physician will handle any further medical issues. Please note that NO REFILLS for any discharge medications will be authorized once you are discharged, as it is imperative that you return to your primary care physician (or establish a relationship with a primary care physician if you do not  have one) for your aftercare needs so that they can reassess your need for medications and monitor your lab values.  Discharge Instructions    Diet - low sodium heart healthy    Complete by:  As directed      Increase activity slowly    Complete by:  As directed             Medication List    TAKE these medications         albuterol 108 (90 BASE) MCG/ACT inhaler  Commonly known as:  PROVENTIL HFA;VENTOLIN HFA  Inhale 2 puffs into the lungs every 6 (six) hours as needed for wheezing or shortness of breath.     Fluticasone-Salmeterol 250-50 MCG/DOSE Aepb  Commonly known as:  ADVAIR DISKUS  Inhale 1 puff into the lungs 2 (two) times daily.     levofloxacin 750 MG tablet  Commonly known as:  LEVAQUIN  Take 1 tablet (750 mg total) by mouth daily.     tiotropium 18 MCG inhalation capsule  Commonly known as:  SPIRIVA HANDIHALER  Place 1 capsule (18 mcg total) into inhaler and inhale daily.       No Known Allergies     Follow-up Information    Follow up with [place], Timoteo Expose, MD.   Specialty:  Family Medicine   Why:  Call for an appointment   Contact information:   [address] [place] [address] [phone]        The results of significant diagnostics from this hospitalization (including imaging, microbiology, ancillary and laboratory) are listed below for reference.    Significant Diagnostic Studies: Dg Chest Port 1 View  01/28/2015  CLINICAL DATA:  Pneumonia. EXAM: PORTABLE CHEST 1 VIEW COMPARISON:  01/26/2015. FINDINGS: Mediastinum and hilar structures are normal. Bibasilar infiltrates. No pleural effusion or pneumothorax. Heart size stable. No acute bony abnormality identified. IMPRESSION: Bibasilar infiltrates consistent with pneumonia. Findings have progressed from prior exam. Electronically Signed   By: Maisie Fus  Register   On: 01/28/2015 07:07   Dg Chest Port 1 View  01/27/2015  CLINICAL DATA:  Dyspnea, 4 days duration. EXAM: PORTABLE CHEST 1 VIEW COMPARISON:  None. FINDINGS: There is hyperinflation. Extensive emphysematous and fibrotic appearing changes are present. No confluent airspace consolidation. No large effusion. No pneumothorax. Hilar and mediastinal contours are unremarkable. IMPRESSION: Hyperinflation with emphysematous changes. The extensive interstitial coarsening  is probably fibrotic, but a component of reversible interstitial infiltrate or edema cannot be excluded. No confluent consolidation or large effusion. Electronically Signed   By: Ellery Plunk M.D.   On: 01/27/2015 00:42    Microbiology: Recent Results (from the past 240 hour(s))  MRSA PCR Screening     Status: None   Collection Time: 01/27/15  3:05 AM  Result Value Ref Range Status   MRSA by PCR NEGATIVE NEGATIVE Final    Comment:        The GeneXpert MRSA Assay (FDA approved for NASAL specimens only), is one component of a comprehensive MRSA colonization surveillance program. It is not intended to diagnose MRSA infection nor to guide or monitor treatment for MRSA infections.   Urine culture     Status: None   Collection Time: 01/27/15  3:35 AM  Result Value Ref Range Status   Specimen Description URINE, CLEAN CATCH  Final   Special Requests NONE  Final   Culture NO GROWTH 1 DAY  Final   Report Status 01/28/2015 FINAL  Final     Labs: Basic  Metabolic Panel:  Recent Labs Lab 01/27/15 0005 01/27/15 0550 01/28/15 0858  NA 140 134* 143  K 4.7 3.6 4.1  CL 95* 97* 103  CO2 34* 29 32  GLUCOSE 151* 119* 94  BUN 23* 17 12  CREATININE 1.03 0.81 0.76  CALCIUM 9.4 8.9 8.9   Liver Function Tests:  Recent Labs Lab 01/27/15 0005 01/27/15 0550  AST 25 16  ALT 12* 12*  ALKPHOS 60 46  BILITOT 0.7 0.8  PROT 7.5 5.2*  ALBUMIN 3.7 2.6*   No results for input(s): LIPASE, AMYLASE in the last 168 hours. No results for input(s): AMMONIA in the last 168 hours. CBC:  Recent Labs Lab 01/27/15 0005 01/27/15 0550 01/28/15 0858  WBC 13.3* 10.6* 10.2  NEUTROABS 10.8* 7.9*  --   HGB 19.2* 16.2 15.7  HCT 57.0* 47.9 47.4  MCV 91.6 93.4 94.6  PLT 238 213 253   Cardiac Enzymes:  Recent Labs Lab 01/27/15 0550 01/27/15 1450 01/27/15 1903 01/28/15 0050  TROPONINI 0.04* <0.03 <0.03 <0.03   BNP: BNP (last 3 results)  Recent Labs  01/27/15 0005  BNP 133.1*     ProBNP (last 3 results) No results for input(s): PROBNP in the last 8760 hours.  CBG: No results for input(s): GLUCAP in the last 168 hours.     SignedCalvert Cantor, MD [facility] 01/28/2015, 11:54 AM

## 2015-02-01 LAB — CULTURE, BLOOD (ROUTINE X 2)
CULTURE: NO GROWTH
Culture: NO GROWTH

## 2017-03-21 ENCOUNTER — Observation Stay (HOSPITAL_BASED_OUTPATIENT_CLINIC_OR_DEPARTMENT_OTHER)
Admission: EM | Admit: 2017-03-21 | Discharge: 2017-03-23 | Disposition: A | Discharge status: Home / Self Care | Payer: Medicare Other | Attending: Family Medicine | Admitting: Family Medicine

## 2017-03-21 ENCOUNTER — Encounter (HOSPITAL_BASED_OUTPATIENT_CLINIC_OR_DEPARTMENT_OTHER): Payer: Self-pay

## 2017-03-21 ENCOUNTER — Emergency Department (HOSPITAL_BASED_OUTPATIENT_CLINIC_OR_DEPARTMENT_OTHER): Payer: Medicare Other

## 2017-03-21 ENCOUNTER — Other Ambulatory Visit: Payer: Self-pay

## 2017-03-21 DIAGNOSIS — J9621 Acute and chronic respiratory failure with hypoxia: Secondary | ICD-10-CM | POA: Diagnosis not present

## 2017-03-21 DIAGNOSIS — J9601 Acute respiratory failure with hypoxia: Secondary | ICD-10-CM | POA: Diagnosis present

## 2017-03-21 DIAGNOSIS — J441 Chronic obstructive pulmonary disease with (acute) exacerbation: Secondary | ICD-10-CM | POA: Insufficient documentation

## 2017-03-21 DIAGNOSIS — E876 Hypokalemia: Secondary | ICD-10-CM | POA: Diagnosis not present

## 2017-03-21 DIAGNOSIS — J449 Chronic obstructive pulmonary disease, unspecified: Secondary | ICD-10-CM | POA: Diagnosis present

## 2017-03-21 DIAGNOSIS — Z79899 Other long term (current) drug therapy: Secondary | ICD-10-CM | POA: Diagnosis not present

## 2017-03-21 DIAGNOSIS — J9620 Acute and chronic respiratory failure, unspecified whether with hypoxia or hypercapnia: Secondary | ICD-10-CM | POA: Diagnosis present

## 2017-03-21 DIAGNOSIS — Z9981 Dependence on supplemental oxygen: Secondary | ICD-10-CM | POA: Diagnosis not present

## 2017-03-21 HISTORY — DX: Chronic obstructive pulmonary disease, unspecified: J44.9

## 2017-03-21 LAB — CBC WITH DIFFERENTIAL/PLATELET
Basophils Absolute: 0.1 10*3/uL (ref 0.0–0.1)
Basophils Relative: 0 %
Eosinophils Absolute: 0 10*3/uL (ref 0.0–0.7)
Eosinophils Relative: 0 %
HEMATOCRIT: 50.2 % (ref 39.0–52.0)
HEMOGLOBIN: 17 g/dL (ref 13.0–17.0)
LYMPHS ABS: 0.7 10*3/uL (ref 0.7–4.0)
Lymphocytes Relative: 3 %
MCH: 30 pg (ref 26.0–34.0)
MCHC: 33.9 g/dL (ref 30.0–36.0)
MCV: 88.5 fL (ref 78.0–100.0)
MONOS PCT: 6 %
Monocytes Absolute: 1.2 10*3/uL — ABNORMAL HIGH (ref 0.1–1.0)
NEUTROS ABS: 19.2 10*3/uL — AB (ref 1.7–7.7)
NEUTROS PCT: 91 %
Platelets: 219 10*3/uL (ref 150–400)
RBC: 5.67 MIL/uL (ref 4.22–5.81)
RDW: 12.6 % (ref 11.5–15.5)
WBC: 21.2 10*3/uL — ABNORMAL HIGH (ref 4.0–10.5)

## 2017-03-21 LAB — COMPREHENSIVE METABOLIC PANEL
ALK PHOS: 64 U/L (ref 38–126)
ALT: 18 U/L (ref 17–63)
ANION GAP: 13 (ref 5–15)
AST: 30 U/L (ref 15–41)
Albumin: 4.3 g/dL (ref 3.5–5.0)
BILIRUBIN TOTAL: 1.8 mg/dL — AB (ref 0.3–1.2)
BUN: 15 mg/dL (ref 6–20)
CO2: 28 mmol/L (ref 22–32)
Calcium: 9.2 mg/dL (ref 8.9–10.3)
Chloride: 95 mmol/L — ABNORMAL LOW (ref 101–111)
Creatinine, Ser: 1.11 mg/dL (ref 0.61–1.24)
GFR calc non Af Amer: 59 mL/min — ABNORMAL LOW (ref >60)
GLUCOSE: 131 mg/dL — AB (ref 65–99)
Potassium: 4 mmol/L (ref 3.5–5.1)
Sodium: 136 mmol/L (ref 135–145)
TOTAL PROTEIN: 7.4 g/dL (ref 6.5–8.1)

## 2017-03-21 LAB — TROPONIN I: Troponin I: 0.04 ng/mL (ref <0.03)

## 2017-03-21 LAB — I-STAT ARTERIAL BLOOD GAS, ED
Acid-Base Excess: 2 mmol/L (ref 0.0–2.0)
BICARBONATE: 28.4 mmol/L — AB (ref 20.0–28.0)
O2 Saturation: 94 %
PH ART: 7.389 (ref 7.350–7.450)
TCO2: 30 mmol/L (ref 22–32)
pCO2 arterial: 47 mmHg (ref 32.0–48.0)
pO2, Arterial: 72 mmHg — ABNORMAL LOW (ref 83.0–108.0)

## 2017-03-21 LAB — I-STAT CG4 LACTIC ACID, ED: LACTIC ACID, VENOUS: 1.69 mmol/L (ref 0.5–1.9)

## 2017-03-21 MED ORDER — AZITHROMYCIN 500 MG IV SOLR
INTRAVENOUS | Status: AC
  Filled 2017-03-21: qty 500

## 2017-03-21 MED ORDER — DEXTROSE 5 % IV SOLN
500.0000 mg | Freq: Once | INTRAVENOUS | Status: AC
  Administered 2017-03-21: 500 mg via INTRAVENOUS
  Filled 2017-03-21: qty 500

## 2017-03-21 MED ORDER — DEXTROSE 5 % IV SOLN
1.0000 g | INTRAVENOUS | Status: DC
  Administered 2017-03-22: 1 g via INTRAVENOUS
  Filled 2017-03-21 (×2): qty 10

## 2017-03-21 MED ORDER — DEXTROSE 5 % IV SOLN
1.0000 g | Freq: Once | INTRAVENOUS | Status: AC
  Administered 2017-03-21: 1 g via INTRAVENOUS
  Filled 2017-03-21: qty 10

## 2017-03-21 MED ORDER — SODIUM CHLORIDE 0.9 % IV BOLUS (SEPSIS)
250.0000 mL | Freq: Once | INTRAVENOUS | Status: AC
  Administered 2017-03-21: 250 mL via INTRAVENOUS

## 2017-03-21 MED ORDER — DEXTROSE 5 % IV SOLN
500.0000 mg | INTRAVENOUS | Status: DC
  Administered 2017-03-22: 500 mg via INTRAVENOUS
  Filled 2017-03-21 (×3): qty 500

## 2017-03-21 MED ORDER — SODIUM CHLORIDE 0.9 % IV BOLUS (SEPSIS)
1000.0000 mL | Freq: Once | INTRAVENOUS | Status: AC
  Administered 2017-03-21: 1000 mL via INTRAVENOUS

## 2017-03-21 MED ORDER — SODIUM CHLORIDE 0.9 % IV BOLUS (SEPSIS)
500.0000 mL | Freq: Once | INTRAVENOUS | Status: AC
  Administered 2017-03-21: 500 mL via INTRAVENOUS

## 2017-03-21 MED ORDER — IPRATROPIUM-ALBUTEROL 0.5-2.5 (3) MG/3ML IN SOLN
3.0000 mL | Freq: Four times a day (QID) | RESPIRATORY_TRACT | Status: DC
  Administered 2017-03-21 – 2017-03-22 (×2): 3 mL via RESPIRATORY_TRACT
  Filled 2017-03-21: qty 3

## 2017-03-21 NOTE — ED Notes (Signed)
Portable x-ray done

## 2017-03-21 NOTE — ED Triage Notes (Signed)
Per stepson pt with prod cough x 1 week-hx of chronic cough-presents to triage in w/c with O2 4L [place]

## 2017-03-21 NOTE — ED Provider Notes (Signed)
[facility] EMERGENCY DEPARTMENT Provider Note   CSN: [id number] Arrival date & time: 03/21/17  2110     History   Chief Complaint Chief Complaint  Patient presents with  . Shortness of Breath    HPI Jeremy Brewer is a 83 y.o. male.  HPI  83 y.o. male with a hx of COPD, presents to the Emergency Department today due to productive cough x 1 week. No rhinorrhea or congestion. Denies fevers. Notes nausea without emesis today, but has since resolved. No CP/ABD pain. No shortness of breath. No diaphoresis. No numbness/tingling. Pt on chronic home O2 with 3L [place]. Does not smoke anymore. Denies pain. Denies sick contacts. No recent hospitalizations. No meds PTA. No other symptoms noted   Past Medical History:  Diagnosis Date  . COPD (chronic obstructive pulmonary disease) (HCC)   . Medical history non-contributory     Patient Active Problem List   Diagnosis Date Noted  . Sepsis (HCC) 01/27/2015  . Community acquired pneumonia 01/27/2015  . COPD (chronic obstructive pulmonary disease) (HCC) 01/27/2015  . Polycythemia 01/27/2015  . Tobacco abuse 01/27/2015  . Acute respiratory failure with hypoxia (HCC) 01/27/2015    Past Surgical History:  Procedure Laterality Date  . APPENDECTOMY       Home Medications    Prior to Admission medications   Medication Sig Start Date End Date Taking? Authorizing Provider  OXYGEN Inhale into the lungs.   Yes [provider]    Family History Family History  Problem Relation Age of Onset  . Diabetes Mellitus II Neg Hx   . CAD Neg Hx     Social History Social History   Tobacco Use  . Smoking status: Former Games developer  . Smokeless tobacco: Never Used  Substance Use Topics  . Alcohol use: No  . Drug use: No     Allergies   Patient has no known allergies.   Review of Systems Review of Systems ROS reviewed and all are negative for acute change except as noted in the HPI.  Physical Exam Updated Vital Signs BP  (!) 171/91   Pulse (!) 110   Temp 97.9 F (36.6 C)   Resp (!) 30   SpO2 92%   Physical Exam  Constitutional: He is oriented to person, place, and time. He appears well-developed and well-nourished. No distress.  HENT:  Head: Normocephalic and atraumatic.  Right Ear: Tympanic membrane, external ear and ear canal normal.  Left Ear: Tympanic membrane, external ear and ear canal normal.  Nose: Nose normal.  Mouth/Throat: Uvula is midline, oropharynx is clear and moist and mucous membranes are normal. No trismus in the jaw. No oropharyngeal exudate, posterior oropharyngeal erythema or tonsillar abscesses.  Eyes: EOM are normal. Pupils are equal, round, and reactive to light.  Neck: Normal range of motion. Neck supple. No tracheal deviation present.  Cardiovascular: Normal rate, regular rhythm, S1 normal, S2 normal, normal heart sounds, intact distal pulses and normal pulses.  Pulmonary/Chest: Effort normal. Tachypnea noted. No respiratory distress. He has decreased breath sounds in the right upper field, the left upper field and the left lower field. He has no wheezes. He has no rhonchi. He has no rales.  Increased from 3L to 4L [place] with O2 sat 92%  Abdominal: Normal appearance and bowel sounds are normal. There is no tenderness.  Musculoskeletal: Normal range of motion.  Neurological: He is alert and oriented to person, place, and time.  Skin: Skin is warm and dry.  Psychiatric: He has  a normal mood and affect. His speech is normal and behavior is normal. Thought content normal.  Nursing note and vitals reviewed.  ED Treatments / Results  Labs (all labs ordered are listed, but only abnormal results are displayed) Labs Reviewed  CBC WITH DIFFERENTIAL/PLATELET - Abnormal; Notable for the following components:      Result Value   WBC 21.2 (*)    Neutro Abs 19.2 (*)    Monocytes Absolute 1.2 (*)    All other components within normal limits  COMPREHENSIVE METABOLIC PANEL - Abnormal;  Notable for the following components:   Chloride 95 (*)    Glucose, Bld 131 (*)    Total Bilirubin 1.8 (*)    GFR calc non Af Amer 59 (*)    All other components within normal limits  TROPONIN I - Abnormal; Notable for the following components:   Troponin I 0.04 (*)    All other components within normal limits  I-STAT ARTERIAL BLOOD GAS, ED - Abnormal; Notable for the following components:   pO2, Arterial 72.0 (*)    Bicarbonate 28.4 (*)    All other components within normal limits  CULTURE, BLOOD (ROUTINE X 2)  CULTURE, BLOOD (ROUTINE X 2)  BLOOD GAS, ARTERIAL  URINALYSIS, ROUTINE W REFLEX MICROSCOPIC  I-STAT CG4 LACTIC ACID, ED    EKG  EKG Interpretation None       Radiology Dg Chest Portable 1 View  Result Date: 03/21/2017 CLINICAL DATA:  83 year old male with productive cough and shortness of breath. EXAM: PORTABLE CHEST 1 VIEW COMPARISON:  Chest radiograph dated 01/28/2015 FINDINGS: There is emphysematous changes of the lungs. There is no focal consolidation, pleural effusion, or pneumothorax. The cardiac silhouette is within normal limits. No acute osseous pathology. IMPRESSION: No acute cardiopulmonary process. Emphysema. Electronically Signed   By: Elgie Collard M.D.   On: 03/21/2017 21:58    Procedures Procedures (including critical care time)  Medications Ordered in ED Medications  ipratropium-albuterol (DUONEB) 0.5-2.5 (3) MG/3ML nebulizer solution 3 mL (3 mLs Nebulization Given 03/21/17 2140)  sodium chloride 0.9 % bolus 1,000 mL (1,000 mLs Intravenous New Bag/Given 03/21/17 2231)    And  sodium chloride 0.9 % bolus 500 mL (500 mLs Intravenous New Bag/Given 03/21/17 2236)    And  sodium chloride 0.9 % bolus 250 mL (not administered)  cefTRIAXone (ROCEPHIN) 1 g in dextrose 5 % 50 mL IVPB (1 g Intravenous New Bag/Given 03/21/17 2234)  azithromycin (ZITHROMAX) 500 mg in dextrose 5 % 250 mL IVPB (not administered)  azithromycin (ZITHROMAX) 500 mg in dextrose  5 % 250 mL IVPB (not administered)  cefTRIAXone (ROCEPHIN) 1 g in dextrose 5 % 50 mL IVPB (not administered)  azithromycin (ZITHROMAX) 500 MG injection (not administered)     Initial Impression / Assessment and Plan / ED Course  I have reviewed the triage vital signs and the nursing notes.  Pertinent labs & imaging results that were available during my care of the patient were reviewed by me and considered in my medical decision making (see chart for details).  Final Clinical Impressions(s) / ED Diagnoses  {I have reviewed and evaluated the relevant laboratory values. {I have reviewed and evaluated the relevant imaging studies. {I have interpreted the relevant EKG. {I have reviewed the relevant previous healthcare records.  {I obtained HPI from historian. {Patient discussed with supervising physician.  ED Course:  Assessment: Pt is a 83 y.o. male with a hx of COPD, presents to the Emergency Department today due to productive  cough x 1 week. No rhinorrhea or congestion. Denies fevers. Notes nausea without emesis today. No CP/ABD pain. No shortness of breath. No diaphoresis. No numbness/tingling. Pt on chronic home O2 with 3L [place]. Denies pain. Denies sick contacts. No recent hospitalizations. No meds PTA. On exam, pt in NAD. Nontoxic/nonseptic appearing. VS with tachycardia 110. Tachypnea 30. Increased oxygen demand from 3L [place] to 4L [place] with O2 saturations 90-92%. Afebrile. Lungs decrease bilaterally. Abdomen nontender soft. Lactic 1.69. CBC with WBC 21.2. No steroid use recently. CMP unremarkable. Trop 0.04. No CP. CXR with emphysema. ABG with bicarb 28. Given azithro/rocephin for CAP in ED. Given 1750 NS bolus per sepsis protocol. Plan is to Admit.   10:53 PM- HR improved with fluids. Tachypnea improved with neb treatments. Remains afebrile. Continues to require Oxygen 4L  sating around 95% at rest.   Disposition/Plan:  Admit Pt acknowledges and agrees with plan  Supervising Physician  Arby BarrettePfeiffer, Marcy, MD  Final diagnoses:  COPD exacerbation [facility](HCC)    ED Discharge Orders    None       Audry PiliMohr, Kentrell Guettler, PA-C 03/21/17 2334    Arby BarrettePfeiffer, Marcy, MD 03/22/17 0002

## 2017-03-21 NOTE — ED Notes (Signed)
ED Provider at bedside. 

## 2017-03-21 NOTE — Progress Notes (Signed)
Patient was on 4 liter nasal cannula of O2 when ABG was collected at 22:00.  Patient's home regimen is 3 liters.

## 2017-03-21 NOTE — ED Notes (Signed)
Contacted [facility] to repage hospitalist @ [facility] Batten)

## 2017-03-21 NOTE — ED Notes (Signed)
Date and time results received: 03/21/17 2218   Test: troponin Critical Value: 0.04  Name of Provider Notified: mohr PA  Orders Received? Or Actions Taken?: no new orders given

## 2017-03-22 ENCOUNTER — Encounter (HOSPITAL_COMMUNITY): Payer: Self-pay | Admitting: *Deleted

## 2017-03-22 DIAGNOSIS — J9621 Acute and chronic respiratory failure with hypoxia: Secondary | ICD-10-CM | POA: Diagnosis not present

## 2017-03-22 DIAGNOSIS — J441 Chronic obstructive pulmonary disease with (acute) exacerbation: Secondary | ICD-10-CM | POA: Diagnosis not present

## 2017-03-22 DIAGNOSIS — J9601 Acute respiratory failure with hypoxia: Secondary | ICD-10-CM

## 2017-03-22 LAB — URINALYSIS, ROUTINE W REFLEX MICROSCOPIC
Bilirubin Urine: NEGATIVE
Glucose, UA: NEGATIVE mg/dL
HGB URINE DIPSTICK: NEGATIVE
Ketones, ur: 15 mg/dL — AB
LEUKOCYTES UA: NEGATIVE
NITRITE: NEGATIVE
PROTEIN: NEGATIVE mg/dL
SPECIFIC GRAVITY, URINE: 1.02 (ref 1.005–1.030)
pH: 6 (ref 5.0–8.0)

## 2017-03-22 MED ORDER — ENOXAPARIN SODIUM 40 MG/0.4ML ~~LOC~~ SOLN
40.0000 mg | SUBCUTANEOUS | Status: DC
  Administered 2017-03-22 – 2017-03-23 (×2): 40 mg via SUBCUTANEOUS
  Filled 2017-03-22 (×2): qty 0.4

## 2017-03-22 MED ORDER — ONDANSETRON HCL 4 MG/2ML IJ SOLN: 4.0000 mg | Freq: Four times a day (QID) | INTRAMUSCULAR | Status: DC | PRN

## 2017-03-22 MED ORDER — PREDNISONE 20 MG PO TABS
40.0000 mg | ORAL_TABLET | Freq: Every day | ORAL | Status: DC
  Administered 2017-03-23: 40 mg via ORAL
  Filled 2017-03-22: qty 2

## 2017-03-22 MED ORDER — ALBUTEROL SULFATE (2.5 MG/3ML) 0.083% IN NEBU: 2.5000 mg | INHALATION_SOLUTION | RESPIRATORY_TRACT | Status: DC | PRN

## 2017-03-22 MED ORDER — ACETAMINOPHEN 650 MG RE SUPP: 650.0000 mg | Freq: Four times a day (QID) | RECTAL | Status: DC | PRN

## 2017-03-22 MED ORDER — IPRATROPIUM-ALBUTEROL 0.5-2.5 (3) MG/3ML IN SOLN
3.0000 mL | Freq: Three times a day (TID) | RESPIRATORY_TRACT | Status: DC
  Administered 2017-03-22 – 2017-03-23 (×4): 3 mL via RESPIRATORY_TRACT
  Filled 2017-03-22 (×5): qty 3

## 2017-03-22 MED ORDER — ONDANSETRON HCL 4 MG PO TABS: 4.0000 mg | ORAL_TABLET | Freq: Four times a day (QID) | ORAL | Status: DC | PRN

## 2017-03-22 MED ORDER — MOMETASONE FURO-FORMOTEROL FUM 200-5 MCG/ACT IN AERO
2.0000 | INHALATION_SPRAY | Freq: Two times a day (BID) | RESPIRATORY_TRACT | Status: DC
  Administered 2017-03-22 – 2017-03-23 (×3): 2 via RESPIRATORY_TRACT
  Filled 2017-03-22: qty 8.8

## 2017-03-22 MED ORDER — ACETAMINOPHEN 325 MG PO TABS: 650.0000 mg | ORAL_TABLET | Freq: Four times a day (QID) | ORAL | Status: DC | PRN

## 2017-03-22 NOTE — Progress Notes (Signed)
Spoke with patient at bedside. States he lives at home with spouse, son is available for assistance as needed, provides transportation. Patient is on home O2 provided by [facility]. Patient states he is independent with ADL's, walks without assistive devices. No issues with medications or affordability. Has PCP, he has seen him in the last year. Will continue to follow for d/c needs. [phone]

## 2017-03-22 NOTE — Progress Notes (Signed)
Nutrition Brief Note  Consult per COPD Gold protocol.   Wt Readings from Last 15 Encounters:  03/22/17 135 lb 4.8 oz (61.4 kg)  01/27/15 126 lb 12.2 oz (57.5 kg)    Body mass index is 21.19 kg/m. Patient meets criteria for normal weight based on current BMI. Skin WDL. Pt with PMH of COPD on 3L O2 at home. Pt admitted for 1 week of SOB and productive cough. Pt reports that his breakfast arrived very late today but that he was hungry and was able to eat all of it; he placed lunch order a minute or two before RD visit. He has been cooking since he was 56-32 years old and still cooks for himself. He enjoys cooking and making items such as steak, biscuits, and cakes and pies from scratch. Pt reports that he usually has a good appetite but that on Christmas he did not eat and yesterday he only ate a few pieces of cheese and crackers d/t not feeling well, being very SOB. He feels much better today and feels he is back to his baseline and is wanting to go home.  Current diet order is Heart Healthy. Labs and medications reviewed.  No nutrition interventions warranted at this time. If nutrition issues arise, please consult RD.     Trenton Gammon, MS, RD, LDN, [facility] Inpatient Clinical Dietitian Pager # [phone] After hours/weekend pager # [phone]

## 2017-03-22 NOTE — ED Notes (Signed)
Son notified of RM assaignment

## 2017-03-22 NOTE — H&P (Signed)
History and Physical    Zaelyn Youngs TIW:[id number] DOB: 03-24-1931 DOA: 03/21/2017  PCP: [facility] [place]  Patient coming from: Home  I have personally briefly reviewed patient's old medical records in [facility]  Chief Complaint: Cough, SOB  HPI: Lamir Sydney is a 83 y.o. male with medical history significant of COPD uses 3L O2 at home chronically.  For the past 1 week patient has developed worsening productive cough and SOB.  No fevers, no vomiting but did have nausea today, no CP, no Apd pain.  Doesn't smoke anymore.  No known sick contacts.   ED Course: WBC 21k, CXR neg, breathing improved with neb treatments somewhat but O2 had to be increased to 4L via [place].  Given empiric rocephin and azithro.  Hospitalist asked to admit.   Review of Systems: As per HPI otherwise 10 point review of systems negative.   Past Medical History:  Diagnosis Date  . COPD (chronic obstructive pulmonary disease) (HCC)   . Medical history non-contributory     Past Surgical History:  Procedure Laterality Date  . APPENDECTOMY       reports that he has quit smoking. he has never used smokeless tobacco. He reports that he does not drink alcohol or use drugs.  No Known Allergies  Family History  Problem Relation Age of Onset  . Diabetes Mellitus II Neg Hx   . CAD Neg Hx      Prior to Admission medications   Medication Sig Start Date End Date Taking? Authorizing Provider  OXYGEN Inhale into the lungs.   Yes [provider]    Physical Exam: Vitals:   03/22/17 0140 03/22/17 0200 03/22/17 0245 03/22/17 0318  BP:  (!) 146/93 (!) 157/75   Pulse: 98 (!) 103 89   Resp: 13 20 20    Temp:   98.4 F (36.9 C)   TempSrc:   Oral   SpO2: 98% 98% 97% 97%  Weight:   61.4 kg (135 lb 4.8 oz)   Height:   5\' 7"  (1.702 m)     Constitutional: NAD, calm, comfortable Eyes: PERRL, lids and conjunctivae normal ENMT: Mucous membranes are moist. Posterior pharynx clear  of any exudate or lesions.Normal dentition.  Neck: normal, supple, no masses, no thyromegaly Respiratory: Diminished breath sounds throughout Cardiovascular: Regular rate and rhythm, no murmurs / rubs / gallops. No extremity edema. 2+ pedal pulses. No carotid bruits.  Abdomen: no tenderness, no masses palpated. No hepatosplenomegaly. Bowel sounds positive.  Musculoskeletal: no clubbing / cyanosis. No joint deformity upper and lower extremities. Good ROM, no contractures. Normal muscle tone.  Skin: no rashes, lesions, ulcers. No induration Neurologic: CN 2-12 grossly intact. Sensation intact, DTR normal. Strength 5/5 in all 4.  Psychiatric: Normal judgment and insight. Alert and oriented x 3. Normal mood.    Labs on Admission: I have personally reviewed following labs and imaging studies  CBC: Recent Labs  Lab 03/21/17 2123  WBC 21.2*  NEUTROABS 19.2*  HGB 17.0  HCT 50.2  MCV 88.5  PLT 219   Basic Metabolic Panel: Recent Labs  Lab 03/21/17 2123  NA 136  K 4.0  CL 95*  CO2 28  GLUCOSE 131*  BUN 15  CREATININE 1.11  CALCIUM 9.2   GFR: Estimated Creatinine Clearance: 43 mL/min (by C-G formula based on SCr of 1.11 mg/dL). Liver Function Tests: Recent Labs  Lab 03/21/17 2123  AST 30  ALT 18  ALKPHOS 64  BILITOT 1.8*  PROT 7.4  ALBUMIN 4.3   No results for input(s): LIPASE, AMYLASE in the last 168 hours. No results for input(s): AMMONIA in the last 168 hours. Coagulation Profile: No results for input(s): INR, PROTIME in the last 168 hours. Cardiac Enzymes: Recent Labs  Lab 03/21/17 2123  TROPONINI 0.04*   BNP (last 3 results) No results for input(s): PROBNP in the last 8760 hours. HbA1C: No results for input(s): HGBA1C in the last 72 hours. CBG: No results for input(s): GLUCAP in the last 168 hours. Lipid Profile: No results for input(s): CHOL, HDL, LDLCALC, TRIG, CHOLHDL, LDLDIRECT in the last 72 hours. Thyroid Function Tests: No results for input(s):  TSH, T4TOTAL, FREET4, T3FREE, THYROIDAB in the last 72 hours. Anemia Panel: No results for input(s): VITAMINB12, FOLATE, FERRITIN, TIBC, IRON, RETICCTPCT in the last 72 hours. Urine analysis:    Component Value Date/Time   COLORURINE YELLOW 03/21/2017 0151   APPEARANCEUR CLEAR 03/21/2017 0151   LABSPEC 1.020 03/21/2017 0151   PHURINE 6.0 03/21/2017 0151   GLUCOSEU NEGATIVE 03/21/2017 0151   HGBUR NEGATIVE 03/21/2017 0151   BILIRUBINUR NEGATIVE 03/21/2017 0151   KETONESUR 15 (A) 03/21/2017 0151   PROTEINUR NEGATIVE 03/21/2017 0151   UROBILINOGEN 1.0 01/27/2015 0336   NITRITE NEGATIVE 03/21/2017 0151   LEUKOCYTESUR NEGATIVE 03/21/2017 0151    Radiological Exams on Admission: Dg Chest Portable 1 View  Result Date: 03/21/2017 CLINICAL DATA:  83 year old male with productive cough and shortness of breath. EXAM: PORTABLE CHEST 1 VIEW COMPARISON:  Chest radiograph dated 01/28/2015 FINDINGS: There is emphysematous changes of the lungs. There is no focal consolidation, pleural effusion, or pneumothorax. The cardiac silhouette is within normal limits. No acute osseous pathology. IMPRESSION: No acute cardiopulmonary process. Emphysema. Electronically Signed   By: Elgie CollardArash  Radparvar M.D.   On: 03/21/2017 21:58    EKG: Independently reviewed.  Assessment/Plan Principal Problem:   Acute on chronic respiratory failure with hypoxia (HCC) Active Problems:   COPD with acute exacerbation (HCC)    1. COPD exacerbation causing acute on chronic resp failure with hypoxia - 1. COPD pathway 2. Neb treatments PRN 3. Will start steroids: Prednisone 40mg  PO daily, first dose now 4. Will continue rocephin / azithro for now given high WBC 5. O2  DVT prophylaxis: Lovenox Code Status: DNR - confirmed with patient Family Communication: No family in room Disposition Plan: Home after admit Consults called: None Admission status: Admit to inpatient   Hillary BowGARDNER, Saryiah Bencosme M. DO [facility] Pager  [phone]  If 7AM-7PM, please contact day team taking care of patient [email or web address] Password [email or web address]  03/22/2017, 5:55 AM

## 2017-03-22 NOTE — Progress Notes (Signed)
Called by [facility] regarding transfer for admission.  Patient is an 83yo with h/o COPD.  He has been coughing x 1 week, no fevers, no CP. + SOB, tachypnea to 32, tachycardia, hypoxia despite baseline O2.  Elevated WBC count.  Given Rocephin/Azithromycin.  CXR negative.  Likely this is a COPD exacerbation.  Elevated troponin to 0.04.  Will admit to telemetry.  Georgana Curio, M.D.

## 2017-03-22 NOTE — Progress Notes (Signed)
Pt admitted after midnight. For details, please refer to admission note done 03/22/2017. Pt admitted with COPD exacerbation. Continue current nebulizer treatments. Continue empiric azithromycin and rocephin.  Manson Passey [facility] [phone]

## 2017-03-22 NOTE — ED Notes (Signed)
Attempt to call report nurse not available 

## 2017-03-22 NOTE — ED Notes (Signed)
Durene Fruits, pt's son:  [phone] (home)  [phone] (cell) Called son and gave an update

## 2017-03-23 DIAGNOSIS — J441 Chronic obstructive pulmonary disease with (acute) exacerbation: Secondary | ICD-10-CM | POA: Diagnosis not present

## 2017-03-23 DIAGNOSIS — J9620 Acute and chronic respiratory failure, unspecified whether with hypoxia or hypercapnia: Secondary | ICD-10-CM | POA: Diagnosis present

## 2017-03-23 LAB — BASIC METABOLIC PANEL
ANION GAP: 5 (ref 5–15)
BUN: 17 mg/dL (ref 6–20)
CO2: 31 mmol/L (ref 22–32)
Calcium: 8.4 mg/dL — ABNORMAL LOW (ref 8.9–10.3)
Chloride: 102 mmol/L (ref 101–111)
Creatinine, Ser: 1.07 mg/dL (ref 0.61–1.24)
Glucose, Bld: 80 mg/dL (ref 65–99)
POTASSIUM: 3.4 mmol/L — AB (ref 3.5–5.1)
SODIUM: 138 mmol/L (ref 135–145)

## 2017-03-23 LAB — CBC
HCT: 41.9 % (ref 39.0–52.0)
HEMOGLOBIN: 14.3 g/dL (ref 13.0–17.0)
MCH: 30.4 pg (ref 26.0–34.0)
MCHC: 34.1 g/dL (ref 30.0–36.0)
MCV: 89.1 fL (ref 78.0–100.0)
PLATELETS: 181 10*3/uL (ref 150–400)
RBC: 4.7 MIL/uL (ref 4.22–5.81)
RDW: 12.6 % (ref 11.5–15.5)
WBC: 11.3 10*3/uL — AB (ref 4.0–10.5)

## 2017-03-23 MED ORDER — IPRATROPIUM-ALBUTEROL 0.5-2.5 (3) MG/3ML IN SOLN: 3.0000 mL | Freq: Four times a day (QID) | RESPIRATORY_TRACT | 2 refills | Status: DC | PRN

## 2017-03-23 MED ORDER — AZITHROMYCIN 500 MG PO TABS: 500.0000 mg | ORAL_TABLET | Freq: Every day | ORAL | 0 refills | Status: AC

## 2017-03-23 MED ORDER — POTASSIUM CHLORIDE CRYS ER 20 MEQ PO TBCR
20.0000 meq | EXTENDED_RELEASE_TABLET | Freq: Once | ORAL | Status: AC
Start: 2017-03-23 — End: 2017-03-23
  Administered 2017-03-23: 20 meq via ORAL
  Filled 2017-03-23: qty 1

## 2017-03-23 MED ORDER — PREDNISONE 10 MG PO TABS: ORAL_TABLET | ORAL | 0 refills | Status: DC

## 2017-03-23 NOTE — Progress Notes (Signed)
Date: March 23, 2017 [facility] notified of need for nebulizer through dme. Bjorn Loser Honesti Seaberg,BSN,RN3,CCM:[phone]

## 2017-03-23 NOTE — Discharge Summary (Signed)
Physician Discharge Summary  Jeremy Brewer ZOX:[id number] DOB: 01-25-1931 DOA: 03/21/2017  PCP: [facility] [place]  Admit date: 03/21/2017 Discharge date: 03/23/2017  Time spent: 25 minutes  Recommendations for Outpatient Follow-up:  1. Follow up PCP in 2 weeks   Discharge Diagnoses:  Principal Problem:   Acute on chronic respiratory failure with hypoxia (HCC) Active Problems:   COPD with acute exacerbation (HCC)   Acute on chronic respiratory failure [facility])   Discharge Condition: Stable  Diet recommendation: Low salt diet  Filed Weights   03/22/17 0245  Weight: 61.4 kg (135 lb 4.8 oz)    History of present illness:  83 y.o. male with medical history significant of COPD uses 3L O2 at home chronically.  For the past 1 week patient has developed worsening productive cough and SOB.  No fevers, no vomiting but did have nausea today, no CP, no Apd pain.  Doesn't smoke anymore.  No known sick contacts.   ED Course: WBC 21k, CXR neg, breathing improved with neb treatments somewhat but O2 had to be increased to 4L via [place].  Given empiric rocephin and azithro.  Hospitalist asked to admit.    Hospital Course:   COPD exacerbation-improved, patient was started on prednisone, ceftriaxone and Zithromax. His breathing has improved. Will discharge on prednisone taper, Duoneb  nebulizers Q6 hours PRN. Will also discharge on Zithromax 500 mg PO daily for three more days. WBC has improved to 11,000, down from 21,000 on 12 26/18.  Hypokalemia- potassium is 3.4 this morning, will replace potassium with schedule 20 meq po x1.   Procedures:  None   Consultations:  None   Discharge Exam: Vitals:   03/23/17 0402 03/23/17 0727  BP: 94/74   Pulse: 83   Resp: 18   Temp: 98.7 F (37.1 C)   SpO2: 95% 92%    General: appears in no acute distress Cardiovascular: S1 S2 regular Respiratory: decreased breath sounds bilaterally  Discharge  Instructions   Discharge Instructions    Diet - low sodium heart healthy   Complete by:  As directed    Increase activity slowly   Complete by:  As directed      Allergies as of 03/23/2017   No Known Allergies     Medication List    TAKE these medications   azithromycin 500 MG tablet Commonly known as:  ZITHROMAX Take 1 tablet (500 mg total) by mouth daily for 3 days. Take 1 tablet daily for 3 days.   ipratropium-albuterol 0.5-2.5 (3) MG/3ML Soln Commonly known as:  DUONEB Take 3 mLs by nebulization every 6 (six) hours as needed.   OXYGEN Inhale into the lungs.   predniSONE 10 MG tablet Commonly known as:  DELTASONE Prednisone 40 mg po daily x 2 day then Prednisone 30 mg po daily x 2 day then Prednisone 20 mg po daily x 2 day then Prednisone 10 mg daily x 2 day then stop...            Durable Medical Equipment  (From admission, onward)        Start     Ordered   03/23/17 1211  For home use only DME Nebulizer machine  Once    Question:  Patient needs a nebulizer to treat with the following condition  Answer:  COPD (chronic obstructive pulmonary disease) (HCC)   03/23/17 1210     No Known Allergies Follow-up Information    [facility] [place] Follow up in 2 week(s).  Specialty:  Family Medicine Contact information: [address] [place] [address] [phone]            The results of significant diagnostics from this hospitalization (including imaging, microbiology, ancillary and laboratory) are listed below for reference.    Significant Diagnostic Studies: Dg Chest Portable 1 View  Result Date: 03/21/2017 CLINICAL DATA:  83 year old male with productive cough and shortness of breath. EXAM: PORTABLE CHEST 1 VIEW COMPARISON:  Chest radiograph dated 01/28/2015 FINDINGS: There is emphysematous changes of the lungs. There is no focal consolidation, pleural effusion, or pneumothorax. The cardiac silhouette is within normal  limits. No acute osseous pathology. IMPRESSION: No acute cardiopulmonary process. Emphysema. Electronically Signed   By: Elgie CollardArash  Radparvar M.D.   On: 03/21/2017 21:58    Microbiology: No results found for this or any previous visit (from the past 240 hour(s)).   Labs: Basic Metabolic Panel: Recent Labs  Lab 03/21/17 2123 03/23/17 0447  NA 136 138  K 4.0 3.4*  CL 95* 102  CO2 28 31  GLUCOSE 131* 80  BUN 15 17  CREATININE 1.11 1.07  CALCIUM 9.2 8.4*   Liver Function Tests: Recent Labs  Lab 03/21/17 2123  AST 30  ALT 18  ALKPHOS 64  BILITOT 1.8*  PROT 7.4  ALBUMIN 4.3   No results for input(s): LIPASE, AMYLASE in the last 168 hours. No results for input(s): AMMONIA in the last 168 hours. CBC: Recent Labs  Lab 03/21/17 2123 03/23/17 0447  WBC 21.2* 11.3*  NEUTROABS 19.2*  --   HGB 17.0 14.3  HCT 50.2 41.9  MCV 88.5 89.1  PLT 219 181   Cardiac Enzymes: Recent Labs  Lab 03/21/17 2123  TROPONINI 0.04*       Signed:  Meredeth IdeGagan S Levern Pitter MD.  [facility] 03/23/2017, 12:43 PM

## 2017-03-23 NOTE — Care Management CC44 (Signed)
Condition Code 44 Documentation Completed  Patient Details  Name: Jeremy Brewer MRN: [id number] Date of Birth: [birth year]   Condition Code 44 given:  Yes Patient signature on Condition Code 44 notice:  Yes Documentation of 2 MD's agreement:  Yes Code 44 added to claim:  Yes    Golda Acre, RN 03/23/2017, 12:57 PM

## 2017-03-23 NOTE — Care Management Obs Status (Signed)
MEDICARE OBSERVATION STATUS NOTIFICATION   Patient Details  Name: Jeremy Brewer MRN: [id number] Date of Birth: 10/26/1930   Medicare Observation Status Notification Given:  Yes    Golda Acre, RN 03/23/2017, 12:57 PM

## 2017-03-27 LAB — CULTURE, BLOOD (ROUTINE X 2)
Culture: NO GROWTH
Culture: NO GROWTH
SPECIAL REQUESTS: ADEQUATE

## 2017-04-19 ENCOUNTER — Ambulatory Visit (HOSPITAL_BASED_OUTPATIENT_CLINIC_OR_DEPARTMENT_OTHER)
Admission: RE | Admit: 2017-04-19 | Discharge: 2017-04-19 | Disposition: A | Discharge status: Home / Self Care | Payer: Medicare Other | Source: Ambulatory Visit | Attending: Pulmonary Disease | Admitting: Pulmonary Disease

## 2017-04-19 ENCOUNTER — Encounter: Payer: Self-pay | Admitting: Pulmonary Disease

## 2017-04-19 ENCOUNTER — Ambulatory Visit (INDEPENDENT_AMBULATORY_CARE_PROVIDER_SITE_OTHER): Payer: Medicare Other | Admitting: Pulmonary Disease

## 2017-04-19 VITALS — BP 114/79 | HR 95 | Ht 68.0 in | Wt 135.0 lb

## 2017-04-19 DIAGNOSIS — J439 Emphysema, unspecified: Secondary | ICD-10-CM | POA: Diagnosis not present

## 2017-04-19 DIAGNOSIS — J441 Chronic obstructive pulmonary disease with (acute) exacerbation: Secondary | ICD-10-CM

## 2017-04-19 DIAGNOSIS — R918 Other nonspecific abnormal finding of lung field: Secondary | ICD-10-CM | POA: Insufficient documentation

## 2017-04-19 DIAGNOSIS — J9611 Chronic respiratory failure with hypoxia: Secondary | ICD-10-CM | POA: Diagnosis not present

## 2017-04-19 DIAGNOSIS — J432 Centrilobular emphysema: Secondary | ICD-10-CM

## 2017-04-19 NOTE — Patient Instructions (Signed)
For severe COPD: Spirometry test today Take Anoro 1 puff daily no matter how you feel Use albuterol or DuoNeb every 4-6 hours as needed for chest tightness wheezing or shortness of breath  For chronic respiratory failure with hypoxemia: Continue using 3 L of oxygen continuously We will check ambulatory oximetry monitoring today in clinic  For abnormal chest x-ray: By my review of your chest x-ray in December it appeared that she had some mild pneumonia We will check a repeat chest x-ray today  We will see you back in 1-2 weeks with a nurse practitioner to see how you are doing with the new inhaler

## 2017-04-19 NOTE — Progress Notes (Signed)
Subjective:   PATIENT ID: Jeremy Brewer GENDER: male DOB: 1930/06/29, MRN: [id number]  Synopsis: Referred in January 2019 for COPD  HPI  Chief Complaint  Patient presents with  . pulm consult    Pt referred by Dr. Izola Price MD for SOB. Increase of SOB rest and exertion last 2 weeks. Pt wheezing, productive cough-white, denies chest tightness    Billal has trouble breathing: > he says that he has had trouble breathing off an on for two years > he says initially it would come and go for only a few weeks at a time > he has been hospitalized a few times, the most recent was in December 2018 > he has been using a nebulizer (duoneb) since, four times per day, he doesn't complete the dose > he says that the duoneb helps when he takes it > he has been on ventolin inhaler years ago, now he doesn't take it > he can only walk 50 feet before he gets short of breath > he doesn't feel chest tightness > he does produce mucus 2-6 times per day  He did not have breahting problems as a kid.  He quit smoking 2016, smoked 2 ppd from age 89 (about 45 years).   He doesn't have any known lung problems.  He worked in farm work, worked for [employer], then [employer], then he worked for [employer].  He worked in Holiday representative some too.  He was adopted at age 77. His three kids have no lung problems.    Past Medical History:  Diagnosis Date  . COPD (chronic obstructive pulmonary disease) (HCC)   . Medical history non-contributory      Family History  Problem Relation Age of Onset  . Diabetes Mellitus II Neg Hx   . CAD Neg Hx      Social History   Socioeconomic History  . Marital status: Married    Spouse name: Not on file  . Number of children: Not on file  . Years of education: Not on file  . Highest education level: Not on file  Social Needs  . Financial resource strain: Not on file  . Food insecurity - worry: Not on file  . Food insecurity - inability: Not on file  .  Transportation needs - medical: Not on file  . Transportation needs - non-medical: Not on file  Occupational History  . Not on file  Tobacco Use  . Smoking status: Former Smoker    Packs/day: 2.00    Types: Cigarettes    Last attempt to quit: 2016    Years since quitting: 3.0  . Smokeless tobacco: Never Used  Substance and Sexual Activity  . Alcohol use: No  . Drug use: No  . Sexual activity: Not on file  Other Topics Concern  . Not on file  Social History Narrative  . Not on file     No Known Allergies   Outpatient Medications Prior to Visit  Medication Sig Dispense Refill  . ipratropium-albuterol (DUONEB) 0.5-2.5 (3) MG/3ML SOLN Take 3 mLs by nebulization every 6 (six) hours as needed. 360 mL 2  . OXYGEN Inhale into the lungs.    . predniSONE (DELTASONE) 10 MG tablet Prednisone 40 mg po daily x 2 day then Prednisone 30 mg po daily x 2 day then Prednisone 20 mg po daily x 2 day then Prednisone 10 mg daily x 2 day then stop... (Patient not taking: Reported on 04/19/2017) 20 tablet 0   No  facility-administered medications prior to visit.     Review of Systems  Constitutional: Negative for chills, fever, malaise/fatigue and weight loss.  HENT: Negative for congestion, ear pain, nosebleeds, sinus pain and sore throat.   Eyes: Negative for photophobia, pain, discharge and redness.  Respiratory: Positive for cough, shortness of breath and wheezing. Negative for hemoptysis and sputum production.   Cardiovascular: Negative for chest pain, palpitations, orthopnea, leg swelling and PND.  Gastrointestinal: Negative for abdominal pain, constipation, diarrhea, nausea and vomiting.  Genitourinary: Negative for dysuria, frequency, hematuria and urgency.  Musculoskeletal: Negative for back pain, joint pain, myalgias and neck pain.  Skin: Negative for itching and rash.  Neurological: Negative for tingling, tremors, sensory change, speech change, focal weakness, seizures, weakness and  headaches.  Endo/Heme/Allergies: Does not bruise/bleed easily.  Psychiatric/Behavioral: Negative for depression, memory loss, substance abuse and suicidal ideas. The patient is not nervous/anxious.       Objective:  Physical Exam   Vitals:   04/19/17 1432  BP: 114/79  Pulse: 95  SpO2: 97%  Weight: 135 lb (61.2 kg)  Height: 5\' 8"  (1.727 m)   3L [place]  Gen: chronically ill appearing, no acute distress HENT: NCAT, OP clear, neck supple without masses Eyes: PERRL, EOMi Lymph: no cervical lymphadenopathy PULM:  Poor air movement, no wheezing CV: RRR, no mgr, no JVD GI: BS+, soft, nontender, no hsm Derm: no rash or skin breakdown MSK: normal bulk and tone Neuro: A&Ox4, CN II-XII intact, strength 5/5 in all 4 extremities Psyche: normal mood and affect   CBC    Component Value Date/Time   WBC 11.3 (H) 03/23/2017 0447   RBC 4.70 03/23/2017 0447   HGB 14.3 03/23/2017 0447   HCT 41.9 03/23/2017 0447   PLT 181 03/23/2017 0447   MCV 89.1 03/23/2017 0447   MCH 30.4 03/23/2017 0447   MCHC 34.1 03/23/2017 0447   RDW 12.6 03/23/2017 0447   LYMPHSABS 0.7 03/21/2017 2123   MONOABS 1.2 (H) 03/21/2017 2123   EOSABS 0.0 03/21/2017 2123   BASOSABS 0.1 03/21/2017 2123     Chest imaging: 02/2017 CXR: emphysema, whipsy upper lobe infiltrates  PFT: 03/2017 Cleda Daub: ratio 31%, FEV1 0.47L (19% pred)  Labs:  Path:  Echo:  Heart Catheterization:   Records from a Decebmer 2018 COPD exacerbation requiring hospitalization reviewed.     Assessment & Plan:   COPD with acute exacerbation (HCC) - Plan: Spirometry with graph, DG Chest 2 View, Spirometry with Graph  Centrilobular emphysema (HCC)  Chronic respiratory failure with hypoxia [facility])  Discussion: Mr. Harland has severe COPD, clear emphysema on chest imaging and chronic respiratory failure with hypoxemia.  He is currently not taking any sort of controller medicine.  Because of his severe exacerbations he is clearly staged at  GOLD Grade D.  At this time he still quite symptomatic, and only using short acting bronchodilators.  I think he would benefit from the addition of long-acting bronchodilators.  His blood work in early December did not show evidence of eosinophilia so I think the best approach is to add a combination long-acting beta agonist and long-acting muscarinic antagonist.  Today's spirometry showed very severe airflow obstruction.  I explained that COPD will not get better and the Anoro will hopefully help with his symptoms.  However with this degree of airflow obstruction I worry that he may need to consider hospice within the next 6 months.  We did not specifically address hospice today in clinic.    Plan: For severe COPD:  Spirometry test today Take Anoro 1 puff daily no matter how you feel Use albuterol or DuoNeb every 4-6 hours as needed for chest tightness wheezing or shortness of breath  For chronic respiratory failure with hypoxemia: Continue using 3 L of oxygen continuously We will check ambulatory oximetry monitoring today in clinic  For abnormal chest x-ray: By my review of your chest x-ray in December it appeared that she had some mild pneumonia We will check a repeat chest x-ray today  We will see you back in 1-2 weeks with a nurse practitioner to see how you are doing with the new inhaler    Current Outpatient Medications:  .  ipratropium-albuterol (DUONEB) 0.5-2.5 (3) MG/3ML SOLN, Take 3 mLs by nebulization every 6 (six) hours as needed., Disp: 360 mL, Rfl: 2 .  OXYGEN, Inhale into the lungs., Disp: , Rfl:

## 2017-04-23 ENCOUNTER — Telehealth: Payer: Self-pay | Admitting: Pulmonary Disease

## 2017-04-23 MED ORDER — UMECLIDINIUM-VILANTEROL 62.5-25 MCG/INH IN AEPB: 1.0000 | INHALATION_SPRAY | Freq: Every day | RESPIRATORY_TRACT | 0 refills | Status: DC

## 2017-04-23 NOTE — Addendum Note (Signed)
Addended by: Cornell Barman on: 04/23/2017 11:54 AM   Modules accepted: Orders

## 2017-04-23 NOTE — Telephone Encounter (Signed)
Left voice mail on machine for patient to return phone call back regarding anoro samples ready for pick up at [place] location office. X1

## 2017-04-26 ENCOUNTER — Encounter: Payer: Self-pay | Admitting: Adult Health

## 2017-04-26 ENCOUNTER — Ambulatory Visit (INDEPENDENT_AMBULATORY_CARE_PROVIDER_SITE_OTHER): Payer: Medicare Other | Admitting: Adult Health

## 2017-04-26 ENCOUNTER — Other Ambulatory Visit: Payer: Self-pay

## 2017-04-26 VITALS — BP 107/78 | HR 100

## 2017-04-26 DIAGNOSIS — J438 Other emphysema: Secondary | ICD-10-CM

## 2017-04-26 DIAGNOSIS — J9621 Acute and chronic respiratory failure with hypoxia: Secondary | ICD-10-CM

## 2017-04-26 DIAGNOSIS — J9611 Chronic respiratory failure with hypoxia: Secondary | ICD-10-CM | POA: Insufficient documentation

## 2017-04-26 DIAGNOSIS — J441 Chronic obstructive pulmonary disease with (acute) exacerbation: Secondary | ICD-10-CM | POA: Diagnosis not present

## 2017-04-26 DIAGNOSIS — R918 Other nonspecific abnormal finding of lung field: Secondary | ICD-10-CM

## 2017-04-26 MED ORDER — UMECLIDINIUM-VILANTEROL 62.5-25 MCG/INH IN AEPB: 1.0000 | INHALATION_SPRAY | Freq: Every day | RESPIRATORY_TRACT | 3 refills | Status: DC

## 2017-04-26 MED ORDER — ALBUTEROL SULFATE HFA 108 (90 BASE) MCG/ACT IN AERS: 2.0000 | INHALATION_SPRAY | Freq: Four times a day (QID) | RESPIRATORY_TRACT | 3 refills | Status: DC | PRN

## 2017-04-26 NOTE — Progress Notes (Signed)
Reviewed, agree 

## 2017-04-26 NOTE — Patient Instructions (Signed)
Continue on ANORO 1 puff daily  May use Ventolin inhaler 2 puffs every 4h as needed for wheezing/shorntess of breath .  May use Duoneb every 6hr as needed for wheezing /shortness of breath .  Continue on Oxygen 3l/m .  Set up CT chest .  Follow up with Dr. Kendrick Fries 6-8 weeks and As needed   Please contact office for sooner follow up if symptoms do not improve or worsen or seek emergency care

## 2017-04-26 NOTE — Addendum Note (Signed)
Addended by: Cornell Barman on: 04/26/2017 04:17 PM   Modules accepted: Orders

## 2017-04-26 NOTE — Assessment & Plan Note (Signed)
Very COPD - improved symptom control on [facility]   Plan  Patient Instructions  Continue on ANORO 1 puff daily  May use Ventolin inhaler 2 puffs every 4h as needed for wheezing/shorntess of breath .  May use Duoneb every 6hr as needed for wheezing /shortness of breath .  Continue on Oxygen 3l/m .  Set up CT chest .  Follow up with Dr. Kendrick Fries 6-8 weeks and As needed   Please contact office for sooner follow up if symptoms do not improve or worsen or seek emergency care

## 2017-04-26 NOTE — Assessment & Plan Note (Signed)
Compensated on O2  

## 2017-04-26 NOTE — Assessment & Plan Note (Signed)
Nodular densities on CXR in Right apex and L base  Check CT chest .

## 2017-04-26 NOTE — Progress Notes (Signed)
@Patient  ID: Jeremy Brewer, male    DOB: 05-15-1930, 84 y.o.   MRN: [id number]  Chief Complaint  Patient presents with  . Follow-up    COPD     Referring provider: Joycelyn Rua, MD  HPI: 84 year old male former smoker seen for pulmonary consult April 19, 2017 for shortness of breath found to have very severe COPD  TEST  Chest imaging: 02/2017 CXR: emphysema, whipsy upper lobe infiltrates  PFT: 03/2017 Cleda Daub: ratio 31%, FEV1 0.47L (19% pred)  04/26/2017 Follow up : Severe COPD , O2 RF , Abnormal Chest xray  Patient returns for a one-week follow-up.  Patient was seen last visit for a pulmonary consult for shortness of breath.  Spirometry showed the patient had very severe COPD.  Spirometry noted an FEV1 at 19% and a ratio at 31.  Patient was recommended to begin on [facility].  Chest x-ray was done and showed nodular densities in the right apex and left lung base.  Patient has been recommended for a CT chest.. Patient is on oxygen 3 L. Since last visit patient is feeling says he is feeling better. Feels the [facility] is really helping .  Using Duoneb As needed  . Since staring ANORO feels he does not need the nebs as much .     No Known Allergies  Immunization History  Administered Date(s) Administered  . Influenza Split 02/17/2017  . Influenza,inj,Quad PF,6+ Mos 01/28/2015  . Pneumococcal Polysaccharide-23 01/28/2015    Past Medical History:  Diagnosis Date  . COPD (chronic obstructive pulmonary disease) (HCC)   . Medical history non-contributory     Tobacco History: Social History   Tobacco Use  Smoking Status Former Smoker  . Packs/day: 2.00  . Types: Cigarettes  . Last attempt to quit: 2016  . Years since quitting: 3.0  Smokeless Tobacco Never Used   Counseling given: Not Answered   Outpatient Encounter Medications as of 04/26/2017  Medication Sig  . ipratropium-albuterol (DUONEB) 0.5-2.5 (3) MG/3ML SOLN Take 3 mLs by nebulization every 6 (six) hours as  needed.  . OXYGEN Inhale into the lungs.  . umeclidinium-vilanterol (ANORO ELLIPTA) 62.5-25 MCG/INH AEPB Inhale 1 puff into the lungs daily.   No facility-administered encounter medications on file as of 04/26/2017.      Review of Systems  Constitutional:   No  weight loss, night sweats,  Fevers, chills, + fatigue, or  lassitude.  HEENT:   No headaches,  Difficulty swallowing,  Tooth/dental problems, or  Sore throat,                No sneezing, itching, ear ache, nasal congestion, post nasal drip,   CV:  No chest pain,  Orthopnea, PND, swelling in lower extremities, anasarca, dizziness, palpitations, syncope.   GI  No heartburn, indigestion, abdominal pain, nausea, vomiting, diarrhea, change in bowel habits, loss of appetite, bloody stools.   Resp: No shortness of breath with exertion or at rest.  No excess mucus, no productive cough,  No non-productive cough,  No coughing up of blood.  No change in color of mucus.  No wheezing.  No chest wall deformity  Skin: no rash or lesions.  GU: no dysuria, change in color of urine, no urgency or frequency.  No flank pain, no hematuria   MS:  No joint pain or swelling.  No decreased range of motion.  No back pain.    Physical Exam  BP 107/78 (BP Location: Left Arm, Cuff Size: Normal)   Pulse 100  SpO2 97%   GEN: A/Ox3; pleasant , NAD, thin and elderly on o2 in wc    HEENT:  [place]/AT,  EACs-clear, TMs-wnl, NOSE-clear, THROAT-clear, no lesions, no postnasal drip or exudate noted. Poor dentition    NECK:  Supple w/ fair ROM; no JVD; normal carotid impulses w/o bruits; no thyromegaly or nodules palpated; no lymphadenopathy.    RESP  Diminished BS in bases  no accessory muscle use, no dullness to percussion  CARD:  RRR, no m/r/g, no peripheral edema, pulses intact, no cyanosis or clubbing.  GI:   Soft & nt; nml bowel sounds; no organomegaly or masses detected.   Musco: Warm bil, no deformities or joint swelling noted.   Neuro: alert,  no focal deficits noted.    Skin: Warm, no lesions or rashes    Lab Results:  CBC   ProBNP No results found for: PROBNP  Imaging: Dg Chest 2 View  Result Date: 04/19/2017 CLINICAL DATA:  Cough and shortness of breath. EXAM: CHEST  2 VIEW COMPARISON:  03/21/2017 FINDINGS: Marked hyperexpansion is compatible with emphysema. Nodular density identified in the right apex superimposed on the right first rib. Another subtle nodular density is seen in the left lung, superimposed on the posterior left ninth rib. The cardiopericardial silhouette is within normal limits for size. The visualized bony structures of the thorax are intact. IMPRESSION: 1. Hyperexpansion compatible with emphysema. 2. Nodular densities in the right apex and left lung base. CT chest without contrast recommended to further evaluate. 3. No edema or evidence for focal pneumonia. Electronically Signed   By: Kennith Center M.D.   On: 04/19/2017 16:24     Assessment & Plan:   COPD (chronic obstructive pulmonary disease) (HCC) Very COPD - improved symptom control on [facility]   Plan  Patient Instructions  Continue on ANORO 1 puff daily  May use Ventolin inhaler 2 puffs every 4h as needed for wheezing/shorntess of breath .  May use Duoneb every 6hr as needed for wheezing /shortness of breath .  Continue on Oxygen 3l/m .  Set up CT chest .  Follow up with Dr. Kendrick Fries 6-8 weeks and As needed   Please contact office for sooner follow up if symptoms do not improve or worsen or seek emergency care       Chronic respiratory failure with hypoxia (HCC) Compensated on O2   Lung nodules Nodular densities on CXR in Right apex and L base  Check CT chest .       Rubye Oaks, NP 04/26/2017

## 2017-05-07 ENCOUNTER — Ambulatory Visit (HOSPITAL_BASED_OUTPATIENT_CLINIC_OR_DEPARTMENT_OTHER)
Admission: RE | Admit: 2017-05-07 | Discharge: 2017-05-07 | Disposition: A | Discharge status: Home / Self Care | Payer: Medicare Other | Source: Ambulatory Visit | Attending: Adult Health | Admitting: Adult Health

## 2017-05-07 DIAGNOSIS — I700 Atherosclerosis of aorta: Secondary | ICD-10-CM | POA: Diagnosis not present

## 2017-05-07 DIAGNOSIS — J441 Chronic obstructive pulmonary disease with (acute) exacerbation: Secondary | ICD-10-CM | POA: Diagnosis present

## 2017-05-07 DIAGNOSIS — I2510 Atherosclerotic heart disease of native coronary artery without angina pectoris: Secondary | ICD-10-CM | POA: Insufficient documentation

## 2017-05-07 DIAGNOSIS — J9611 Chronic respiratory failure with hypoxia: Secondary | ICD-10-CM | POA: Insufficient documentation

## 2017-05-07 DIAGNOSIS — R918 Other nonspecific abnormal finding of lung field: Secondary | ICD-10-CM | POA: Insufficient documentation

## 2017-05-07 DIAGNOSIS — J438 Other emphysema: Secondary | ICD-10-CM | POA: Diagnosis not present

## 2017-05-08 ENCOUNTER — Telehealth: Payer: Self-pay | Admitting: Adult Health

## 2017-05-08 DIAGNOSIS — R918 Other nonspecific abnormal finding of lung field: Secondary | ICD-10-CM

## 2017-05-08 NOTE — Telephone Encounter (Signed)
Called and spoke with patient, aware of results, CT ordered. Nothing further needed.

## 2017-05-27 DIAGNOSIS — J449 Chronic obstructive pulmonary disease, unspecified: Secondary | ICD-10-CM | POA: Diagnosis not present

## 2017-06-04 ENCOUNTER — Encounter: Payer: Self-pay | Admitting: Pulmonary Disease

## 2017-06-04 ENCOUNTER — Ambulatory Visit (INDEPENDENT_AMBULATORY_CARE_PROVIDER_SITE_OTHER): Payer: Medicare HMO | Admitting: Pulmonary Disease

## 2017-06-04 VITALS — BP 134/80 | HR 82 | Ht 68.0 in | Wt 143.0 lb

## 2017-06-04 DIAGNOSIS — J9611 Chronic respiratory failure with hypoxia: Secondary | ICD-10-CM

## 2017-06-04 DIAGNOSIS — J438 Other emphysema: Secondary | ICD-10-CM

## 2017-06-04 DIAGNOSIS — R918 Other nonspecific abnormal finding of lung field: Secondary | ICD-10-CM | POA: Diagnosis not present

## 2017-06-04 NOTE — Patient Instructions (Signed)
Pulmonary nodules: As we discussed these are solid areas in your lung which radiology feels are due to inflammation.  We will need to follow these with repeat CT scans, the next will be ordered in August 2019.  COPD: Continue Anoro daily Use albuterol or the DuoNeb every 4 hours as needed for chest tightness wheezing or shortness of breath  Chronic respiratory failure with hypoxemia: I am glad you are staying active, I encourage you to use 3 L of oxygen on exertion.  We will see you back in August 2019

## 2017-06-04 NOTE — Progress Notes (Signed)
Subjective:   PATIENT ID: Jeremy Brewer GENDER: male DOB: June 27, 1930, MRN: [id number]  Synopsis: Referred in January 2019 for COPD He also has pulmonary nodules.  HPI  Chief Complaint  Patient presents with  . Follow-up    pt c/o sob with exertion.     Jeremy Brewer says that he has only needed to use duoneb once since starting Anoro.  He calls it a "miracle drug".  He says that his breathing is much improved.  He coughs about 4 times per day which is much improved.  He coughs up some mucus from time to time.  It is white and forthy, non-bloody.  Rarely it will be yellow.   Weight is stable.  He is using his oxygen more and is able to walk a lot and is able to carry his oxygen.    He is accompanied by his Jeremy Brewer today.   Past Medical History:  Diagnosis Date  . COPD (chronic obstructive pulmonary disease) (HCC)   . Medical history non-contributory       Review of Systems  Constitutional: Negative for chills, fever, malaise/fatigue and weight loss.  HENT: Negative for congestion, ear pain, nosebleeds, sinus pain and sore throat.   Eyes: Negative for photophobia, pain, discharge and redness.  Respiratory: Positive for cough, shortness of breath and wheezing. Negative for hemoptysis and sputum production.   Cardiovascular: Negative for chest pain, palpitations, orthopnea, leg swelling and PND.  Gastrointestinal: Negative for abdominal pain, constipation, diarrhea, nausea and vomiting.  Genitourinary: Negative for dysuria, frequency, hematuria and urgency.  Musculoskeletal: Negative for back pain, joint pain, myalgias and neck pain.  Skin: Negative for itching and rash.  Neurological: Negative for tingling, tremors, sensory change, speech change, focal weakness, seizures, weakness and headaches.  Endo/Heme/Allergies: Does not bruise/bleed easily.  Psychiatric/Behavioral: Negative for depression, memory loss, substance abuse and suicidal ideas. The patient is not  nervous/anxious.       Objective:  Physical Exam   Vitals:   06/04/17 1425  BP: 134/80  Pulse: 82  SpO2: 95%  Weight: 143 lb (64.9 kg)  Height: 5\' 8"  (1.727 m)   3L Jeremy Brewer  Gen: chronically ill appearing HENT: OP clear, TM's clear, neck supple, poor dentition PULM: Poor air movement but no wheezing B, normal percussion CV: RRR, no mgr, trace edema GI: BS+, soft, nontender Derm: no cyanosis or rash Psyche: normal mood and affect    CBC    Component Value Date/Time   WBC 11.3 (H) 03/23/2017 0447   RBC 4.70 03/23/2017 0447   HGB 14.3 03/23/2017 0447   HCT 41.9 03/23/2017 0447   PLT 181 03/23/2017 0447   MCV 89.1 03/23/2017 0447   MCH 30.4 03/23/2017 0447   MCHC 34.1 03/23/2017 0447   RDW 12.6 03/23/2017 0447   LYMPHSABS 0.7 03/21/2017 2123   MONOABS 1.2 (H) 03/21/2017 2123   EOSABS 0.0 03/21/2017 2123   BASOSABS 0.1 03/21/2017 2123     Chest imaging: 02/2017 CXR: emphysema, whipsy upper lobe infiltrates February 2019 CT chest images independently reviewed showing emphysema bilaterally, scattered pulmonary nodules throughout favored to represent an inflammatory etiology, largest approximately 2 cm in the right lower lobe  PFT: 03/2017 Jeremy Brewer: ratio 31%, FEV1 0.47L (19% pred)  Labs:  Path:  Echo:  Heart Catheterization:   Records from a Jeremy Brewer 2018 COPD exacerbation requiring hospitalization reviewed.     Assessment & Plan:   Other emphysema (HCC)  Abnormal findings on diagnostic imaging of lung - Plan: CT Chest  Wo Contrast  Chronic respiratory failure with hypoxia Jeremy Brewer)  Discussion: Jeremy Brewer has done really well since starting Anoro.  He says this is helped his symptoms quite a bit.  Today we talked about the difference between controller and rescue medications and I reviewed appropriate technique and use.  He does have pulmonary nodules.  Some are as large as 2 cm though given the distribution radiology feels that this likely represents an  inflammatory etiology.  He needs serial imaging to follow these closely.  Discussion: Pulmonary nodules: As we discussed these are solid areas in your lung which radiology feels are due to inflammation.  We will need to follow these with repeat CT scans, the next will be ordered in August 2019.  COPD: Continue Anoro daily Use albuterol or the DuoNeb every 4 hours as needed for chest tightness wheezing or shortness of breath  Chronic respiratory failure with hypoxemia: I am glad you are staying active, I encourage you to use 3 L of oxygen on exertion.  We will see you back in August 2019    Current Outpatient Medications:  .  albuterol (PROVENTIL HFA;VENTOLIN HFA) 108 (90 Base) MCG/ACT inhaler, Inhale 2 puffs into the lungs every 6 (six) hours as needed for wheezing or shortness of breath., Disp: 1 Inhaler, Rfl: 3 .  ipratropium-albuterol (DUONEB) 0.5-2.5 (3) MG/3ML SOLN, Take 3 mLs by nebulization every 6 (six) hours as needed., Disp: 360 mL, Rfl: 2 .  OXYGEN, Inhale into the lungs., Disp: , Rfl:  .  umeclidinium-vilanterol (ANORO ELLIPTA) 62.5-25 MCG/INH AEPB, Inhale 1 puff into the lungs daily., Disp: 1 each, Rfl: 3

## 2017-06-21 DIAGNOSIS — J189 Pneumonia, unspecified organism: Secondary | ICD-10-CM | POA: Diagnosis not present

## 2017-06-21 DIAGNOSIS — J441 Chronic obstructive pulmonary disease with (acute) exacerbation: Secondary | ICD-10-CM | POA: Diagnosis not present

## 2017-06-21 DIAGNOSIS — J449 Chronic obstructive pulmonary disease, unspecified: Secondary | ICD-10-CM | POA: Diagnosis not present

## 2017-06-25 DIAGNOSIS — J449 Chronic obstructive pulmonary disease, unspecified: Secondary | ICD-10-CM | POA: Diagnosis not present

## 2017-06-27 DIAGNOSIS — J449 Chronic obstructive pulmonary disease, unspecified: Secondary | ICD-10-CM | POA: Diagnosis not present

## 2017-07-22 DIAGNOSIS — J449 Chronic obstructive pulmonary disease, unspecified: Secondary | ICD-10-CM | POA: Diagnosis not present

## 2017-07-22 DIAGNOSIS — J189 Pneumonia, unspecified organism: Secondary | ICD-10-CM | POA: Diagnosis not present

## 2017-07-22 DIAGNOSIS — J441 Chronic obstructive pulmonary disease with (acute) exacerbation: Secondary | ICD-10-CM | POA: Diagnosis not present

## 2017-07-27 DIAGNOSIS — J449 Chronic obstructive pulmonary disease, unspecified: Secondary | ICD-10-CM | POA: Diagnosis not present

## 2017-08-06 ENCOUNTER — Ambulatory Visit (HOSPITAL_BASED_OUTPATIENT_CLINIC_OR_DEPARTMENT_OTHER)
Admission: RE | Admit: 2017-08-06 | Discharge: 2017-08-06 | Disposition: A | Discharge status: Home / Self Care | Payer: Medicare HMO | Source: Ambulatory Visit | Attending: Adult Health | Admitting: Adult Health

## 2017-08-06 DIAGNOSIS — R918 Other nonspecific abnormal finding of lung field: Secondary | ICD-10-CM | POA: Diagnosis not present

## 2017-08-06 DIAGNOSIS — I2510 Atherosclerotic heart disease of native coronary artery without angina pectoris: Secondary | ICD-10-CM | POA: Insufficient documentation

## 2017-08-06 DIAGNOSIS — I700 Atherosclerosis of aorta: Secondary | ICD-10-CM | POA: Diagnosis not present

## 2017-08-06 DIAGNOSIS — J439 Emphysema, unspecified: Secondary | ICD-10-CM | POA: Insufficient documentation

## 2017-08-08 ENCOUNTER — Other Ambulatory Visit: Payer: Self-pay | Admitting: Adult Health

## 2017-08-08 DIAGNOSIS — R918 Other nonspecific abnormal finding of lung field: Secondary | ICD-10-CM

## 2017-08-08 NOTE — Progress Notes (Signed)
Per result note from TP, 6 month follow up CT ordered to follow up on pulmonary nodules.

## 2017-08-21 DIAGNOSIS — J449 Chronic obstructive pulmonary disease, unspecified: Secondary | ICD-10-CM | POA: Diagnosis not present

## 2017-08-21 DIAGNOSIS — J189 Pneumonia, unspecified organism: Secondary | ICD-10-CM | POA: Diagnosis not present

## 2017-08-21 DIAGNOSIS — J441 Chronic obstructive pulmonary disease with (acute) exacerbation: Secondary | ICD-10-CM | POA: Diagnosis not present

## 2017-08-27 DIAGNOSIS — J449 Chronic obstructive pulmonary disease, unspecified: Secondary | ICD-10-CM | POA: Diagnosis not present

## 2017-09-14 ENCOUNTER — Telehealth: Payer: Self-pay | Admitting: Pulmonary Disease

## 2017-09-14 NOTE — Telephone Encounter (Signed)
Left message for Viviann Spare to call back. According to Patient chart LOV 06/04/17, O2 3L [place], and diagnosis codes J96.1/J44.1.

## 2017-09-17 NOTE — Telephone Encounter (Signed)
Spoke with Jeremy Brewer. He stated that he did receive the VM from Friday, however it did not contain all of the information needed.   [name] that the patient was last seen in the office on 06/04/17 by BQ. At that time, patient was advised to stay on 3L of O2 with exertion. I will also fax a copy of patient's last OV to [facility] at [phone].   Nothing else needed at time of call. Will close this encounter.

## 2017-09-21 DIAGNOSIS — J449 Chronic obstructive pulmonary disease, unspecified: Secondary | ICD-10-CM | POA: Diagnosis not present

## 2017-09-21 DIAGNOSIS — J441 Chronic obstructive pulmonary disease with (acute) exacerbation: Secondary | ICD-10-CM | POA: Diagnosis not present

## 2017-09-21 DIAGNOSIS — J189 Pneumonia, unspecified organism: Secondary | ICD-10-CM | POA: Diagnosis not present

## 2017-09-26 DIAGNOSIS — J449 Chronic obstructive pulmonary disease, unspecified: Secondary | ICD-10-CM | POA: Diagnosis not present

## 2017-10-08 DIAGNOSIS — J449 Chronic obstructive pulmonary disease, unspecified: Secondary | ICD-10-CM | POA: Diagnosis not present

## 2017-10-21 DIAGNOSIS — J189 Pneumonia, unspecified organism: Secondary | ICD-10-CM | POA: Diagnosis not present

## 2017-10-21 DIAGNOSIS — J441 Chronic obstructive pulmonary disease with (acute) exacerbation: Secondary | ICD-10-CM | POA: Diagnosis not present

## 2017-10-21 DIAGNOSIS — J449 Chronic obstructive pulmonary disease, unspecified: Secondary | ICD-10-CM | POA: Diagnosis not present

## 2017-10-30 ENCOUNTER — Ambulatory Visit (HOSPITAL_BASED_OUTPATIENT_CLINIC_OR_DEPARTMENT_OTHER)
Admission: RE | Admit: 2017-10-30 | Discharge: 2017-10-30 | Disposition: A | Discharge status: Home / Self Care | Payer: Medicare HMO | Source: Ambulatory Visit | Attending: Pulmonary Disease | Admitting: Pulmonary Disease

## 2017-10-30 DIAGNOSIS — R918 Other nonspecific abnormal finding of lung field: Secondary | ICD-10-CM

## 2017-10-30 DIAGNOSIS — I700 Atherosclerosis of aorta: Secondary | ICD-10-CM | POA: Diagnosis not present

## 2017-10-30 DIAGNOSIS — J439 Emphysema, unspecified: Secondary | ICD-10-CM | POA: Insufficient documentation

## 2017-10-31 ENCOUNTER — Other Ambulatory Visit: Payer: Self-pay

## 2017-11-01 ENCOUNTER — Encounter: Payer: Self-pay | Admitting: Pulmonary Disease

## 2017-11-01 ENCOUNTER — Ambulatory Visit (INDEPENDENT_AMBULATORY_CARE_PROVIDER_SITE_OTHER): Payer: Medicare HMO | Admitting: Pulmonary Disease

## 2017-11-01 VITALS — BP 130/70 | HR 109 | Wt 156.0 lb

## 2017-11-01 DIAGNOSIS — R911 Solitary pulmonary nodule: Secondary | ICD-10-CM | POA: Diagnosis not present

## 2017-11-01 DIAGNOSIS — J9611 Chronic respiratory failure with hypoxia: Secondary | ICD-10-CM

## 2017-11-01 DIAGNOSIS — J438 Other emphysema: Secondary | ICD-10-CM | POA: Diagnosis not present

## 2017-11-01 NOTE — Patient Instructions (Signed)
COPD: Keep taking Anoro 1 puff daily no matter how you feel Use albuterol as needed for chest tightness wheezing or shortness of breath Get a flu shot in the fall, I recommend the high dose Practice good hand hygiene Stay active  Chronic respiratory failure with hypoxemia: Use 3 L of oxygen continuously  Pulmonary nodule: I am pleased that these have decreased in size We will make sure you have another CT scan in 6 months  We will see you back in 6 months or sooner if needed

## 2017-11-01 NOTE — Progress Notes (Signed)
Subjective:   PATIENT ID: Marcelle Smiling GENDER: male DOB: 11/15/1930, MRN: [id number]  Synopsis: Referred in January 2019 for COPD He also has pulmonary nodules.  HPI  Chief Complaint  Patient presents with  . Follow-up    follow up COPD and CT scan. patient has SOB on exertion   Zyhir says that his breathing has been OK since the last visit.  He hasn't needed to use his duoneb much.  He still takes the albuterol inhaler 2x per week.  He really likes the Anoro, it really helps.     Past Medical History:  Diagnosis Date  . COPD (chronic obstructive pulmonary disease) (HCC)   . Medical history non-contributory       Review of Systems  Constitutional: Negative for chills, fever, malaise/fatigue and weight loss.  HENT: Negative for congestion, ear pain, nosebleeds, sinus pain and sore throat.   Eyes: Negative for photophobia, pain, discharge and redness.  Respiratory: Positive for cough, shortness of breath and wheezing. Negative for hemoptysis and sputum production.   Cardiovascular: Negative for chest pain, palpitations, orthopnea, leg swelling and PND.  Gastrointestinal: Negative for abdominal pain, constipation, diarrhea, nausea and vomiting.  Genitourinary: Negative for dysuria, frequency, hematuria and urgency.  Musculoskeletal: Negative for back pain, joint pain, myalgias and neck pain.  Skin: Negative for itching and rash.  Neurological: Negative for tingling, tremors, sensory change, speech change, focal weakness, seizures, weakness and headaches.  Endo/Heme/Allergies: Does not bruise/bleed easily.  Psychiatric/Behavioral: Negative for depression, memory loss, substance abuse and suicidal ideas. The patient is not nervous/anxious.       Objective:  Physical Exam   Vitals:   11/01/17 1502  BP: 130/70  Pulse: (!) 109  SpO2: 93%  Weight: 156 lb 0.6 oz (70.8 kg)   3L [place]  Gen: chronically ill appearing HENT: OP clear, TM's clear, neck supple PULM: Poor air  movement, some wheezing B, normal percussion CV: RRR, no mgr, trace edema GI: BS+, soft, nontender Derm: no cyanosis or rash Psyche: normal mood and affect     CBC    Component Value Date/Time   WBC 11.3 (H) 03/23/2017 0447   RBC 4.70 03/23/2017 0447   HGB 14.3 03/23/2017 0447   HCT 41.9 03/23/2017 0447   PLT 181 03/23/2017 0447   MCV 89.1 03/23/2017 0447   MCH 30.4 03/23/2017 0447   MCHC 34.1 03/23/2017 0447   RDW 12.6 03/23/2017 0447   LYMPHSABS 0.7 03/21/2017 2123   MONOABS 1.2 (H) 03/21/2017 2123   EOSABS 0.0 03/21/2017 2123   BASOSABS 0.1 03/21/2017 2123     Chest imaging: 02/2017 CXR: emphysema, whipsy upper lobe infiltrates February 2019 CT chest images independently reviewed showing emphysema bilaterally, scattered pulmonary nodules throughout favored to represent an inflammatory etiology, largest approximately 2 cm in the right lower lobe 10/2017 CT cest> stable pulmonary nodules, largest 24mm, emphysema noted  PFT: 03/2017 Cleda Daub: ratio 31%, FEV1 0.47L (19% pred)  Labs:  Path:  Echo:  Heart Catheterization:   Records from a [place]ce[place] 2018 COPD exacerbation requiring hospitalization reviewed.     Assessment & Plan:   No diagnosis found.  Discussion: This has been a stable interval for Mr. Jemison.  He feels that the Anoro is quite helpful.  He has not had an exacerbation since the last visit.  His pulmonary nodules have decreased in size which is reassuring.  Plan: COPD: Keep taking Anoro 1 puff daily no matter how you feel Use albuterol as needed for chest tightness  wheezing or shortness of breath Get a flu shot in the fall, I recommend the high dose Practice good hand hygiene Stay active  Chronic respiratory failure with hypoxemia: Use 3 L of oxygen continuously  Pulmonary nodule: I am pleased that these have decreased in size We will make sure you have another CT scan in 6 months  We will see you back in 6 months or sooner if  needed    Current Outpatient Medications:  .  albuterol (PROVENTIL HFA;VENTOLIN HFA) 108 (90 Base) MCG/ACT inhaler, Inhale 2 puffs into the lungs every 6 (six) hours as needed for wheezing or shortness of breath., Disp: 1 Inhaler, Rfl: 3 .  ipratropium-albuterol (DUONEB) 0.5-2.5 (3) MG/3ML SOLN, Take 3 mLs by nebulization every 6 (six) hours as needed., Disp: 360 mL, Rfl: 2 .  OXYGEN, Inhale into the lungs., Disp: , Rfl:  .  umeclidinium-vilanterol (ANORO ELLIPTA) 62.5-25 MCG/INH AEPB, Inhale 1 puff into the lungs daily., Disp: 1 each, Rfl: 3

## 2017-11-08 DIAGNOSIS — J449 Chronic obstructive pulmonary disease, unspecified: Secondary | ICD-10-CM | POA: Diagnosis not present

## 2017-11-08 DIAGNOSIS — J439 Emphysema, unspecified: Secondary | ICD-10-CM | POA: Diagnosis not present

## 2017-11-08 DIAGNOSIS — M545 Low back pain: Secondary | ICD-10-CM | POA: Diagnosis not present

## 2017-11-08 DIAGNOSIS — R110 Nausea: Secondary | ICD-10-CM | POA: Diagnosis not present

## 2017-11-08 DIAGNOSIS — R509 Fever, unspecified: Secondary | ICD-10-CM | POA: Diagnosis not present

## 2017-11-08 DIAGNOSIS — N3001 Acute cystitis with hematuria: Secondary | ICD-10-CM | POA: Diagnosis not present

## 2017-11-12 ENCOUNTER — Encounter (HOSPITAL_BASED_OUTPATIENT_CLINIC_OR_DEPARTMENT_OTHER): Payer: Self-pay

## 2017-11-12 ENCOUNTER — Other Ambulatory Visit: Payer: Self-pay

## 2017-11-12 ENCOUNTER — Emergency Department (HOSPITAL_BASED_OUTPATIENT_CLINIC_OR_DEPARTMENT_OTHER)
Admission: EM | Admit: 2017-11-12 | Discharge: 2017-11-12 | Disposition: A | Discharge status: Home / Self Care | Payer: Medicare HMO | Attending: Emergency Medicine | Admitting: Emergency Medicine

## 2017-11-12 DIAGNOSIS — E860 Dehydration: Secondary | ICD-10-CM | POA: Insufficient documentation

## 2017-11-12 DIAGNOSIS — J449 Chronic obstructive pulmonary disease, unspecified: Secondary | ICD-10-CM | POA: Diagnosis not present

## 2017-11-12 DIAGNOSIS — N179 Acute kidney failure, unspecified: Secondary | ICD-10-CM | POA: Insufficient documentation

## 2017-11-12 DIAGNOSIS — Z87891 Personal history of nicotine dependence: Secondary | ICD-10-CM | POA: Diagnosis not present

## 2017-11-12 DIAGNOSIS — R0602 Shortness of breath: Secondary | ICD-10-CM | POA: Diagnosis present

## 2017-11-12 DIAGNOSIS — Z79899 Other long term (current) drug therapy: Secondary | ICD-10-CM | POA: Diagnosis not present

## 2017-11-12 DIAGNOSIS — R197 Diarrhea, unspecified: Secondary | ICD-10-CM

## 2017-11-12 LAB — COMPREHENSIVE METABOLIC PANEL
ALBUMIN: 3.4 g/dL — AB (ref 3.5–5.0)
ALT: 64 U/L — AB (ref 0–44)
AST: 93 U/L — AB (ref 15–41)
Alkaline Phosphatase: 55 U/L (ref 38–126)
Anion gap: 14 (ref 5–15)
BUN: 36 mg/dL — AB (ref 8–23)
CHLORIDE: 92 mmol/L — AB (ref 98–111)
CO2: 24 mmol/L (ref 22–32)
Calcium: 7.9 mg/dL — ABNORMAL LOW (ref 8.9–10.3)
Creatinine, Ser: 1.43 mg/dL — ABNORMAL HIGH (ref 0.61–1.24)
GFR calc Af Amer: 50 mL/min — ABNORMAL LOW (ref >60)
GFR, EST NON AFRICAN AMERICAN: 43 mL/min — AB (ref >60)
GLUCOSE: 134 mg/dL — AB (ref 70–99)
Potassium: 4 mmol/L (ref 3.5–5.1)
Sodium: 130 mmol/L — ABNORMAL LOW (ref 135–145)
Total Bilirubin: 0.9 mg/dL (ref 0.3–1.2)
Total Protein: 6.1 g/dL — ABNORMAL LOW (ref 6.5–8.1)

## 2017-11-12 LAB — URINALYSIS, ROUTINE W REFLEX MICROSCOPIC
Bilirubin Urine: NEGATIVE
GLUCOSE, UA: NEGATIVE mg/dL
KETONES UR: 15 mg/dL — AB
Leukocytes, UA: NEGATIVE
Nitrite: NEGATIVE
PH: 5.5 (ref 5.0–8.0)
Protein, ur: 100 mg/dL — AB
Specific Gravity, Urine: 1.02 (ref 1.005–1.030)

## 2017-11-12 LAB — CBC WITH DIFFERENTIAL/PLATELET
Basophils Absolute: 0 10*3/uL (ref 0.0–0.1)
Basophils Relative: 0 %
Eosinophils Absolute: 0 10*3/uL (ref 0.0–0.7)
Eosinophils Relative: 0 %
HEMATOCRIT: 50.2 % (ref 39.0–52.0)
Hemoglobin: 18.3 g/dL — ABNORMAL HIGH (ref 13.0–17.0)
LYMPHS ABS: 0.6 10*3/uL — AB (ref 0.7–4.0)
LYMPHS PCT: 8 %
MCH: 29.5 pg (ref 26.0–34.0)
MCHC: 36.5 g/dL — AB (ref 30.0–36.0)
MCV: 81 fL (ref 78.0–100.0)
MONO ABS: 0.4 10*3/uL (ref 0.1–1.0)
MONOS PCT: 6 %
NEUTROS ABS: 6.1 10*3/uL (ref 1.7–7.7)
Neutrophils Relative %: 86 %
Platelets: 74 10*3/uL — ABNORMAL LOW (ref 150–400)
RBC: 6.2 MIL/uL — ABNORMAL HIGH (ref 4.22–5.81)
RDW: 13.3 % (ref 11.5–15.5)
WBC: 7.1 10*3/uL (ref 4.0–10.5)

## 2017-11-12 LAB — URINALYSIS, MICROSCOPIC (REFLEX)

## 2017-11-12 LAB — LIPASE, BLOOD: LIPASE: 29 U/L (ref 11–51)

## 2017-11-12 MED ORDER — SODIUM CHLORIDE 0.9 % IV BOLUS
500.0000 mL | Freq: Once | INTRAVENOUS | Status: AC
Start: 2017-11-12 — End: 2017-11-12
  Administered 2017-11-12: 500 mL via INTRAVENOUS

## 2017-11-12 MED ORDER — SODIUM CHLORIDE 0.9 % IV BOLUS
500.0000 mL | Freq: Once | INTRAVENOUS | Status: AC
  Administered 2017-11-12: 500 mL via INTRAVENOUS

## 2017-11-12 NOTE — ED Notes (Signed)
Increased dyspnea noted when ambulating pt. Pt SpO2 dropped down to 87%. Pt resting on side of bed, breathing techniques discussed. SpO2 increased to 100%. Pt reported feeling better and wanting to go home.

## 2017-11-12 NOTE — ED Provider Notes (Signed)
4:20 PM Care assumed from Dr. Fredderick Phenix.    At time of transfer care, patient is awaiting reassessment after his rehydration.  If patient is able to ambulate safely after rehydration and is feeling better, plan of care is to discharge patient.  5:44 PM Patient felt much better after rehydration and was able to ambulate without difficulty.  He wanted to be discharged home.  Patient was advised that he does have evidence of dehydration and acute kidney injury however we had a shared decision making conversation and both he and family want to take him home.  Patient will call his primary doctor in the morning and discuss follow-up in the next few days.  Patient understood return precautions for any further dehydration and symptoms.  Patient had no other questions or concerns and was discharged in good condition.     Clinical Impression: 1. Dehydration   2. Diarrhea, unspecified type   3. AKI (acute kidney injury) (HCC)     Disposition: Discharge  Condition: Good  I have discussed the results, Dx and Tx plan with the pt(& family if present). He/she/they expressed understanding and agree(s) with the plan. Discharge instructions discussed at great length. Strict return precautions discussed and pt &/or family have verbalized understanding of the instructions. No further questions at time of discharge.    New Prescriptions   No medications on file    Follow Up: Joycelyn Rua, MD [address] [place] [address] [phone]     [facility] [place] [facility] [address] [id number] DS KAJG [place] [address] [phone]       Tegeler, Canary Brim, MD 11/12/17 1745

## 2017-11-12 NOTE — Discharge Instructions (Signed)
Your work-up today showed dehydration likely due to your diarrhea.  Your kidney function was slightly elevated which is why we provided fluids.  We had a shared decision making conversation after your rehydration and you were feeling better and wanted to go home.  We feel this is an appropriate plan.  Please follow-up with your primary doctor in several days and if you have any new or worsened symptoms, please return to the nearest emergency department for further management.

## 2017-11-12 NOTE — ED Notes (Signed)
ED Provider at bedside. 

## 2017-11-12 NOTE — ED Triage Notes (Signed)
Per stepson pt with increase in SOB-pt on O2 3 LNC  diarrhea x 7 days-was seen by PCP last Friday-started macrobid for UTI-pt to triage in w/c-labored breathing on home O2

## 2017-11-12 NOTE — ED Notes (Signed)
Encouraged patient to void,  He is attempting

## 2017-11-12 NOTE — ED Provider Notes (Signed)
[facility] EMERGENCY DEPARTMENT Provider Note   CSN: [id number] Arrival date & time: 11/12/17  1132     History   Chief Complaint Chief Complaint  Patient presents with  . Shortness of Breath    HPI Jeremy Brewer is a 84 y.o. male.  Patient is a 84 year old male with a history of COPD who presents with weakness and decreased appetite.  He states he has had a 2-week history of diarrhea.  He states the diarrhea resolved about 3 days ago but he still feels generally weak and has no appetite.  He has baseline shortness of breath which he says is not any different than his normal symptoms.  He is oxygen dependent at 3 L/min.  He has no increased cough.  No fevers.  No abdominal pain.  No blood in his stool.  He still says he just does not have much of an appetite.  He has no nausea or vomiting.  He had one episode of vomiting when the diarrhea began 3 weeks ago but none currently.     Past Medical History:  Diagnosis Date  . COPD (chronic obstructive pulmonary disease) (HCC)   . Medical history non-contributory     Patient Active Problem List   Diagnosis Date Noted  . Chronic respiratory failure with hypoxia (HCC) 04/26/2017  . Lung nodules 04/26/2017  . Acute on chronic respiratory failure (HCC) 03/23/2017  . Acute on chronic respiratory failure with hypoxia (HCC) 03/22/2017  . Sepsis (HCC) 01/27/2015  . Community acquired pneumonia 01/27/2015  . COPD (chronic obstructive pulmonary disease) (HCC) 01/27/2015  . Polycythemia 01/27/2015  . Tobacco abuse 01/27/2015    Past Surgical History:  Procedure Laterality Date  . APPENDECTOMY          Home Medications    Prior to Admission medications   Medication Sig Start Date End Date Taking? Authorizing Provider  albuterol (PROVENTIL HFA;VENTOLIN HFA) 108 (90 Base) MCG/ACT inhaler Inhale 2 puffs into the lungs every 6 (six) hours as needed for wheezing or shortness of breath. 04/26/17   Parrett, Tammy S, NP    ipratropium-albuterol (DUONEB) 0.5-2.5 (3) MG/3ML SOLN Take 3 mLs by nebulization every 6 (six) hours as needed. 03/23/17   Meredeth Ide, MD  OXYGEN Inhale into the lungs.    [provider]  umeclidinium-vilanterol (ANORO ELLIPTA) 62.5-25 MCG/INH AEPB Inhale 1 puff into the lungs daily. 04/26/17   Parrett, Virgel Bouquet, NP    Family History Family History  Problem Relation Age of Onset  . Diabetes Mellitus II Neg Hx   . CAD Neg Hx     Social History Social History   Tobacco Use  . Smoking status: Former Smoker    Packs/day: 2.00    Types: Cigarettes    Last attempt to quit: 2016    Years since quitting: 3.6  . Smokeless tobacco: Never Used  Substance Use Topics  . Alcohol use: No  . Drug use: No     Allergies   Patient has no known allergies.   Review of Systems Review of Systems  Constitutional: Positive for appetite change and fatigue. Negative for chills, diaphoresis and fever.  HENT: Negative for congestion, rhinorrhea and sneezing.   Eyes: Negative.   Respiratory: Negative for cough, chest tightness and shortness of breath.   Cardiovascular: Negative for chest pain and leg swelling.  Gastrointestinal: Negative for abdominal pain, blood in stool, diarrhea, nausea and vomiting.  Genitourinary: Negative for difficulty urinating, flank pain, frequency and hematuria.  Musculoskeletal:  Negative for arthralgias and back pain.  Skin: Negative for rash.  Neurological: Negative for dizziness, speech difficulty, weakness, numbness and headaches.     Physical Exam Updated Vital Signs BP 92/71   Pulse 77   Temp 98.5 F (36.9 C) (Oral)   Resp 19   Ht 5\' 7"  (1.702 m)   Wt 69.9 kg   SpO2 97%   BMI 24.12 kg/m   Physical Exam  Constitutional: He is oriented to person, place, and time. He appears well-developed and well-nourished.  HENT:  Head: Normocephalic and atraumatic.  Eyes: Pupils are equal, round, and reactive to light.  Neck: Normal range of motion.  Neck supple.  Cardiovascular: Normal rate, regular rhythm and normal heart sounds.  Pulmonary/Chest: Effort normal. No respiratory distress. He has decreased breath sounds. He has no wheezes. He has no rales. He exhibits no tenderness.  Abdominal: Soft. Bowel sounds are normal. There is no tenderness. There is no rebound and no guarding.  Musculoskeletal: Normal range of motion. He exhibits no edema.  Lymphadenopathy:    He has no cervical adenopathy.  Neurological: He is alert and oriented to person, place, and time.  Skin: Skin is warm and dry. No rash noted.  Psychiatric: He has a normal mood and affect.     ED Treatments / Results  Labs (all labs ordered are listed, but only abnormal results are displayed) Labs Reviewed  COMPREHENSIVE METABOLIC PANEL - Abnormal; Notable for the following components:      Result Value   Sodium 130 (*)    Chloride 92 (*)    Glucose, Bld 134 (*)    BUN 36 (*)    Creatinine, Ser 1.43 (*)    Calcium 7.9 (*)    Total Protein 6.1 (*)    Albumin 3.4 (*)    AST 93 (*)    ALT 64 (*)    GFR calc non Af Amer 43 (*)    GFR calc Af Amer 50 (*)    All other components within normal limits  CBC WITH DIFFERENTIAL/PLATELET - Abnormal; Notable for the following components:   RBC 6.20 (*)    Hemoglobin 18.3 (*)    MCHC 36.5 (*)    Platelets 74 (*)    Lymphs Abs 0.6 (*)    All other components within normal limits  URINALYSIS, ROUTINE W REFLEX MICROSCOPIC - Abnormal; Notable for the following components:   Color, Urine AMBER (*)    APPearance HAZY (*)    Hgb urine dipstick MODERATE (*)    Ketones, ur 15 (*)    Protein, ur 100 (*)    All other components within normal limits  URINALYSIS, MICROSCOPIC (REFLEX) - Abnormal; Notable for the following components:   Bacteria, UA MANY (*)    All other components within normal limits  LIPASE, BLOOD  HEPATITIS PANEL, ACUTE    EKG EKG Interpretation  Date/Time:  Monday November 12 2017 11:45:51  EDT Ventricular Rate:  81 PR Interval:    QRS Duration: 117 QT Interval:  355 QTC Calculation: 412 R Axis:   153 Text Interpretation:  Sinus rhythm Nonspecific intraventricular conduction delay Inferior infarct, old since last tracing no significant change Confirmed by Rolan Bucco [phone]) on 11/12/2017 12:22:41 PM   Radiology No results found.  Procedures Procedures (including critical care time)  Medications Ordered in ED Medications  sodium chloride 0.9 % bolus 500 mL ( Intravenous Stopped 11/12/17 1502)  sodium chloride 0.9 % bolus 500 mL (0 mLs Intravenous Stopped 11/12/17  1503)     Initial Impression / Assessment and Plan / ED Course  I have reviewed the triage vital signs and the nursing notes.  Pertinent labs & imaging results that were available during my care of the patient were reviewed by me and considered in my medical decision making (see chart for details).     Patient is a 84 year old male who presents with generalized fatigue and decreased appetite after a bout of diarrhea.  He has no abdominal pain.  No ongoing diarrhea.  No fever.  His labs show a mild acute kidney injury as well as hyponatremia and some mild elevation in his LFTs.  He also has a low platelet count but no evidence of active bleeding.  Hepatitis panel was sent.  He is being given IV fluids.  His breathing is at baseline per his report.  He will be reassessed after his IV fluids.  If he is feeling better and is able to ambulate without significant symptoms, he likely can be discharged with close outpatient follow-up.  If not, he may need admission for further treatment.  Care turned over to Dr. Rush Landmark pending reevaluation.  Final Clinical Impressions(s) / ED Diagnoses   Final diagnoses:  None    ED Discharge Orders    None       Rolan Bucco, MD 11/12/17 [phone]

## 2017-11-12 NOTE — ED Notes (Signed)
Pt given crackers and drink for PO challenge 

## 2017-11-13 LAB — HEPATITIS PANEL, ACUTE
HEP A IGM: NEGATIVE
Hep B C IgM: NEGATIVE
Hepatitis B Surface Ag: NEGATIVE

## 2017-11-15 DIAGNOSIS — E860 Dehydration: Secondary | ICD-10-CM | POA: Diagnosis not present

## 2017-11-15 DIAGNOSIS — E871 Hypo-osmolality and hyponatremia: Secondary | ICD-10-CM | POA: Diagnosis not present

## 2017-11-15 DIAGNOSIS — R945 Abnormal results of liver function studies: Secondary | ICD-10-CM | POA: Diagnosis not present

## 2017-11-15 DIAGNOSIS — N179 Acute kidney failure, unspecified: Secondary | ICD-10-CM | POA: Diagnosis not present

## 2017-11-15 DIAGNOSIS — M545 Low back pain: Secondary | ICD-10-CM | POA: Diagnosis not present

## 2017-11-15 DIAGNOSIS — R54 Age-related physical debility: Secondary | ICD-10-CM | POA: Diagnosis not present

## 2017-11-21 DIAGNOSIS — J441 Chronic obstructive pulmonary disease with (acute) exacerbation: Secondary | ICD-10-CM | POA: Diagnosis not present

## 2017-11-21 DIAGNOSIS — J449 Chronic obstructive pulmonary disease, unspecified: Secondary | ICD-10-CM | POA: Diagnosis not present

## 2017-11-21 DIAGNOSIS — J189 Pneumonia, unspecified organism: Secondary | ICD-10-CM | POA: Diagnosis not present

## 2017-11-22 DIAGNOSIS — J439 Emphysema, unspecified: Secondary | ICD-10-CM | POA: Diagnosis not present

## 2017-11-22 DIAGNOSIS — E860 Dehydration: Secondary | ICD-10-CM | POA: Diagnosis not present

## 2017-11-22 DIAGNOSIS — R509 Fever, unspecified: Secondary | ICD-10-CM | POA: Diagnosis not present

## 2017-11-22 DIAGNOSIS — R110 Nausea: Secondary | ICD-10-CM | POA: Diagnosis not present

## 2017-12-08 DIAGNOSIS — H269 Unspecified cataract: Secondary | ICD-10-CM | POA: Diagnosis not present

## 2017-12-08 DIAGNOSIS — N529 Male erectile dysfunction, unspecified: Secondary | ICD-10-CM | POA: Diagnosis not present

## 2017-12-08 DIAGNOSIS — R030 Elevated blood-pressure reading, without diagnosis of hypertension: Secondary | ICD-10-CM | POA: Diagnosis not present

## 2017-12-08 DIAGNOSIS — Z9981 Dependence on supplemental oxygen: Secondary | ICD-10-CM | POA: Diagnosis not present

## 2017-12-08 DIAGNOSIS — Z87891 Personal history of nicotine dependence: Secondary | ICD-10-CM | POA: Diagnosis not present

## 2017-12-08 DIAGNOSIS — R269 Unspecified abnormalities of gait and mobility: Secondary | ICD-10-CM | POA: Diagnosis not present

## 2017-12-08 DIAGNOSIS — M1990 Unspecified osteoarthritis, unspecified site: Secondary | ICD-10-CM | POA: Diagnosis not present

## 2017-12-08 DIAGNOSIS — J309 Allergic rhinitis, unspecified: Secondary | ICD-10-CM | POA: Diagnosis not present

## 2017-12-08 DIAGNOSIS — J439 Emphysema, unspecified: Secondary | ICD-10-CM | POA: Diagnosis not present

## 2017-12-08 DIAGNOSIS — K08409 Partial loss of teeth, unspecified cause, unspecified class: Secondary | ICD-10-CM | POA: Diagnosis not present

## 2017-12-09 DIAGNOSIS — J449 Chronic obstructive pulmonary disease, unspecified: Secondary | ICD-10-CM | POA: Diagnosis not present

## 2017-12-22 DIAGNOSIS — J441 Chronic obstructive pulmonary disease with (acute) exacerbation: Secondary | ICD-10-CM | POA: Diagnosis not present

## 2017-12-22 DIAGNOSIS — J189 Pneumonia, unspecified organism: Secondary | ICD-10-CM | POA: Diagnosis not present

## 2017-12-22 DIAGNOSIS — J449 Chronic obstructive pulmonary disease, unspecified: Secondary | ICD-10-CM | POA: Diagnosis not present

## 2017-12-25 DIAGNOSIS — J449 Chronic obstructive pulmonary disease, unspecified: Secondary | ICD-10-CM | POA: Diagnosis not present

## 2017-12-25 DIAGNOSIS — Z23 Encounter for immunization: Secondary | ICD-10-CM | POA: Diagnosis not present

## 2017-12-25 DIAGNOSIS — J9611 Chronic respiratory failure with hypoxia: Secondary | ICD-10-CM | POA: Diagnosis not present

## 2017-12-27 ENCOUNTER — Emergency Department (HOSPITAL_COMMUNITY): Payer: Medicare HMO

## 2017-12-27 ENCOUNTER — Other Ambulatory Visit: Payer: Self-pay

## 2017-12-27 ENCOUNTER — Inpatient Hospital Stay (HOSPITAL_COMMUNITY)
Admission: EM | Admit: 2017-12-27 | Discharge: 2017-12-30 | DRG: 190 | Disposition: A | Discharge status: Home Health | Payer: Medicare HMO | Attending: Internal Medicine | Admitting: Internal Medicine

## 2017-12-27 ENCOUNTER — Encounter (HOSPITAL_COMMUNITY): Payer: Self-pay

## 2017-12-27 DIAGNOSIS — Z66 Do not resuscitate: Secondary | ICD-10-CM | POA: Diagnosis not present

## 2017-12-27 DIAGNOSIS — R000 Tachycardia, unspecified: Secondary | ICD-10-CM | POA: Diagnosis not present

## 2017-12-27 DIAGNOSIS — J9622 Acute and chronic respiratory failure with hypercapnia: Secondary | ICD-10-CM | POA: Diagnosis not present

## 2017-12-27 DIAGNOSIS — J440 Chronic obstructive pulmonary disease with acute lower respiratory infection: Secondary | ICD-10-CM | POA: Diagnosis present

## 2017-12-27 DIAGNOSIS — B9789 Other viral agents as the cause of diseases classified elsewhere: Secondary | ICD-10-CM | POA: Diagnosis present

## 2017-12-27 DIAGNOSIS — J218 Acute bronchiolitis due to other specified organisms: Secondary | ICD-10-CM | POA: Diagnosis not present

## 2017-12-27 DIAGNOSIS — Z7951 Long term (current) use of inhaled steroids: Secondary | ICD-10-CM

## 2017-12-27 DIAGNOSIS — Z9981 Dependence on supplemental oxygen: Secondary | ICD-10-CM

## 2017-12-27 DIAGNOSIS — N179 Acute kidney failure, unspecified: Secondary | ICD-10-CM | POA: Diagnosis not present

## 2017-12-27 DIAGNOSIS — R0902 Hypoxemia: Secondary | ICD-10-CM | POA: Diagnosis not present

## 2017-12-27 DIAGNOSIS — R0602 Shortness of breath: Secondary | ICD-10-CM | POA: Diagnosis not present

## 2017-12-27 DIAGNOSIS — J441 Chronic obstructive pulmonary disease with (acute) exacerbation: Secondary | ICD-10-CM | POA: Diagnosis not present

## 2017-12-27 DIAGNOSIS — J9621 Acute and chronic respiratory failure with hypoxia: Secondary | ICD-10-CM | POA: Diagnosis not present

## 2017-12-27 DIAGNOSIS — Z87891 Personal history of nicotine dependence: Secondary | ICD-10-CM

## 2017-12-27 DIAGNOSIS — R0689 Other abnormalities of breathing: Secondary | ICD-10-CM | POA: Diagnosis not present

## 2017-12-27 DIAGNOSIS — J9601 Acute respiratory failure with hypoxia: Secondary | ICD-10-CM

## 2017-12-27 DIAGNOSIS — I959 Hypotension, unspecified: Secondary | ICD-10-CM | POA: Diagnosis present

## 2017-12-27 DIAGNOSIS — J449 Chronic obstructive pulmonary disease, unspecified: Secondary | ICD-10-CM | POA: Diagnosis not present

## 2017-12-27 DIAGNOSIS — I10 Essential (primary) hypertension: Secondary | ICD-10-CM | POA: Diagnosis not present

## 2017-12-27 LAB — COMPREHENSIVE METABOLIC PANEL
ALT: 14 U/L (ref 0–44)
ANION GAP: 12 (ref 5–15)
AST: 24 U/L (ref 15–41)
Albumin: 3.5 g/dL (ref 3.5–5.0)
Alkaline Phosphatase: 51 U/L (ref 38–126)
BILIRUBIN TOTAL: 1.4 mg/dL — AB (ref 0.3–1.2)
BUN: 26 mg/dL — ABNORMAL HIGH (ref 8–23)
CALCIUM: 9 mg/dL (ref 8.9–10.3)
CO2: 23 mmol/L (ref 22–32)
Chloride: 101 mmol/L (ref 98–111)
Creatinine, Ser: 1.54 mg/dL — ABNORMAL HIGH (ref 0.61–1.24)
GFR, EST AFRICAN AMERICAN: 46 mL/min — AB (ref >60)
GFR, EST NON AFRICAN AMERICAN: 40 mL/min — AB (ref >60)
GLUCOSE: 134 mg/dL — AB (ref 70–99)
Potassium: 4.3 mmol/L (ref 3.5–5.1)
Sodium: 136 mmol/L (ref 135–145)
TOTAL PROTEIN: 7.2 g/dL (ref 6.5–8.1)

## 2017-12-27 LAB — CBC WITH DIFFERENTIAL/PLATELET
BASOS ABS: 0.2 10*3/uL — AB (ref 0.0–0.1)
Basophils Relative: 1 %
EOS ABS: 0 10*3/uL (ref 0.0–0.7)
Eosinophils Relative: 0 %
HEMATOCRIT: 50.2 % (ref 39.0–52.0)
Hemoglobin: 15.7 g/dL (ref 13.0–17.0)
LYMPHS PCT: 25 %
Lymphs Abs: 3.8 10*3/uL (ref 0.7–4.0)
MCH: 28.5 pg (ref 26.0–34.0)
MCHC: 31.3 g/dL (ref 30.0–36.0)
MCV: 91.1 fL (ref 78.0–100.0)
MONOS PCT: 15 %
Monocytes Absolute: 2.3 10*3/uL — ABNORMAL HIGH (ref 0.1–1.0)
NEUTROS ABS: 9 10*3/uL — AB (ref 1.7–7.7)
Neutrophils Relative %: 59 %
Platelets: 331 10*3/uL (ref 150–400)
RBC: 5.51 MIL/uL (ref 4.22–5.81)
RDW: 12.6 % (ref 11.5–15.5)
WBC: 15.3 10*3/uL — AB (ref 4.0–10.5)

## 2017-12-27 LAB — I-STAT CG4 LACTIC ACID, ED: Lactic Acid, Venous: 3.1 mmol/L (ref 0.5–1.9)

## 2017-12-27 LAB — I-STAT ARTERIAL BLOOD GAS, ED
Acid-base deficit: 3 mmol/L — ABNORMAL HIGH (ref 0.0–2.0)
Bicarbonate: 24.4 mmol/L (ref 20.0–28.0)
O2 SAT: 98 %
PCO2 ART: 49.3 mmHg — AB (ref 32.0–48.0)
PH ART: 7.301 — AB (ref 7.350–7.450)
TCO2: 26 mmol/L (ref 22–32)
pO2, Arterial: 106 mmHg (ref 83.0–108.0)

## 2017-12-27 LAB — TROPONIN I: TROPONIN I: 0.06 ng/mL — AB (ref <0.03)

## 2017-12-27 LAB — BRAIN NATRIURETIC PEPTIDE: B NATRIURETIC PEPTIDE 5: 178.8 pg/mL — AB (ref 0.0–100.0)

## 2017-12-27 MED ORDER — MAGNESIUM SULFATE 2 GM/50ML IV SOLN
2.0000 g | Freq: Once | INTRAVENOUS | Status: AC
  Administered 2017-12-27: 2 g via INTRAVENOUS
  Filled 2017-12-27: qty 50

## 2017-12-27 MED ORDER — ALBUTEROL SULFATE (2.5 MG/3ML) 0.083% IN NEBU
2.5000 mg | INHALATION_SOLUTION | Freq: Once | RESPIRATORY_TRACT | Status: AC
  Administered 2017-12-27: 2.5 mg via RESPIRATORY_TRACT
  Filled 2017-12-27: qty 3

## 2017-12-27 MED ORDER — SODIUM CHLORIDE 0.9 % IV BOLUS
1000.0000 mL | Freq: Once | INTRAVENOUS | Status: AC
  Administered 2017-12-27: 1000 mL via INTRAVENOUS

## 2017-12-27 MED ORDER — ENOXAPARIN SODIUM 40 MG/0.4ML ~~LOC~~ SOLN
40.0000 mg | SUBCUTANEOUS | Status: DC
  Administered 2017-12-27 – 2017-12-29 (×3): 40 mg via SUBCUTANEOUS
  Filled 2017-12-27 (×3): qty 0.4

## 2017-12-27 MED ORDER — SODIUM CHLORIDE 0.9 % IV SOLN
500.0000 mg | Freq: Once | INTRAVENOUS | Status: AC
  Administered 2017-12-27: 500 mg via INTRAVENOUS
  Filled 2017-12-27: qty 500

## 2017-12-27 MED ORDER — SODIUM CHLORIDE 0.9 % IV SOLN
1.0000 g | Freq: Once | INTRAVENOUS | Status: AC
  Administered 2017-12-27: 1 g via INTRAVENOUS
  Filled 2017-12-27: qty 10

## 2017-12-27 MED ORDER — SODIUM CHLORIDE 0.9 % IV SOLN
1.0000 g | INTRAVENOUS | Status: DC
  Administered 2017-12-28 – 2017-12-29 (×2): 1 g via INTRAVENOUS
  Filled 2017-12-27 (×3): qty 10

## 2017-12-27 MED ORDER — SODIUM CHLORIDE 0.9 % IV SOLN
INTRAVENOUS | Status: AC
  Administered 2017-12-27: 23:00:00 via INTRAVENOUS

## 2017-12-27 MED ORDER — IPRATROPIUM-ALBUTEROL 0.5-2.5 (3) MG/3ML IN SOLN: 3.0000 mL | Freq: Four times a day (QID) | RESPIRATORY_TRACT | Status: DC

## 2017-12-27 MED ORDER — UMECLIDINIUM-VILANTEROL 62.5-25 MCG/INH IN AEPB
1.0000 | INHALATION_SPRAY | Freq: Every day | RESPIRATORY_TRACT | Status: DC
  Administered 2017-12-28 – 2017-12-30 (×3): 1 via RESPIRATORY_TRACT
  Filled 2017-12-27: qty 14

## 2017-12-27 MED ORDER — PREDNISONE 20 MG PO TABS
40.0000 mg | ORAL_TABLET | Freq: Every day | ORAL | Status: DC
  Administered 2017-12-28 – 2017-12-30 (×3): 40 mg via ORAL
  Filled 2017-12-27 (×3): qty 2

## 2017-12-27 MED ORDER — ALBUTEROL SULFATE (2.5 MG/3ML) 0.083% IN NEBU: 2.5000 mg | INHALATION_SOLUTION | RESPIRATORY_TRACT | Status: DC | PRN

## 2017-12-27 NOTE — H&P (Signed)
History and Physical    Jeremy Brewer GTX:[id number] DOB: 06-Oct-1930 DOA: 12/27/2017  PCP: Joycelyn Rua, MD  Patient coming from: Home  I have personally briefly reviewed patient's old medical records in [facility]  Chief Complaint: SOB  HPI: [place] is a 84 y.o. male with medical history significant of COPD on 3L O2 at baseline.  Patient presents to the ED with 7 day history of worsening SOB.  This has progressed to the point of respiratory distress where his son had to call EMS to get him in to the ED.  He has been using the Anoro every day at home.   ED Course: Patient given 125 solumedrol, 10mg  albuterol, magnesium, rocephin / azithro.  CXR shows COPD.  WBC 15k, Lactate 3.1.  Creat 1.5 (1.4 in Aug).   Review of Systems: As per HPI otherwise 10 point review of systems negative.   Past Medical History:  Diagnosis Date  . COPD (chronic obstructive pulmonary disease) (HCC)     Past Surgical History:  Procedure Laterality Date  . APPENDECTOMY       reports that he quit smoking about 3 years ago. His smoking use included cigarettes. He smoked 2.00 packs per day. He has never used smokeless tobacco. He reports that he does not drink alcohol or use drugs.  No Known Allergies  Family History  Problem Relation Age of Onset  . Diabetes Mellitus II Neg Hx   . CAD Neg Hx      Prior to Admission medications   Medication Sig Start Date End Date Taking? Authorizing Provider  albuterol (PROVENTIL HFA;VENTOLIN HFA) 108 (90 Base) MCG/ACT inhaler Inhale 2 puffs into the lungs every 6 (six) hours as needed for wheezing or shortness of breath. 04/26/17   Parrett, Tammy S, NP  ipratropium-albuterol (DUONEB) 0.5-2.5 (3) MG/3ML SOLN Take 3 mLs by nebulization every 6 (six) hours as needed. 03/23/17   Meredeth Ide, MD  OXYGEN Inhale into the lungs.    [provider]  umeclidinium-vilanterol (ANORO ELLIPTA) 62.5-25 MCG/INH AEPB Inhale 1 puff into the lungs daily.  04/26/17   Julio Sicks, NP    Physical Exam: Vitals:   12/27/17 1929 12/27/17 1945 12/27/17 2000 12/27/17 2030  BP:  (!) 145/75 133/70 105/76  Pulse: (!) 118 (!) 117 (!) 121 (!) 110  Resp: (!) 23 (!) 21 (!) 30 (!) 30  Temp:      TempSrc:      SpO2: 100% 97% 92% 98%  Weight:      Height:        Constitutional: NAD, calm, comfortable Eyes: PERRL, lids and conjunctivae normal ENMT: Mucous membranes are moist. Posterior pharynx clear of any exudate or lesions.Normal dentition.  Neck: normal, supple, no masses, no thyromegaly Respiratory: Few wheezes Cardiovascular: Regular rate and rhythm, no murmurs / rubs / gallops. No extremity edema. 2+ pedal pulses. No carotid bruits.  Abdomen: no tenderness, no masses palpated. No hepatosplenomegaly. Bowel sounds positive.  Musculoskeletal: no clubbing / cyanosis. No joint deformity upper and lower extremities. Good ROM, no contractures. Normal muscle tone.  Skin: no rashes, lesions, ulcers. No induration Neurologic: CN 2-12 grossly intact. Sensation intact, DTR normal. Strength 5/5 in all 4.  Psychiatric: Normal judgment and insight. Alert and oriented x 3. Normal mood.    Labs on Admission: I have personally reviewed following labs and imaging studies  CBC: Recent Labs  Lab 12/27/17 1856  WBC 15.3*  NEUTROABS 9.0*  HGB 15.7  HCT 50.2  MCV 91.1  PLT 331   Basic Metabolic Panel: Recent Labs  Lab 12/27/17 1856  NA 136  K 4.3  CL 101  CO2 23  GLUCOSE 134*  BUN 26*  CREATININE 1.54*  CALCIUM 9.0   GFR: Estimated Creatinine Clearance: 35.4 mL/min (A) (by C-G formula based on SCr of 1.54 mg/dL (H)). Liver Function Tests: Recent Labs  Lab 12/27/17 1856  AST 24  ALT 14  ALKPHOS 51  BILITOT 1.4*  PROT 7.2  ALBUMIN 3.5   No results for input(s): LIPASE, AMYLASE in the last 168 hours. No results for input(s): AMMONIA in the last 168 hours. Coagulation Profile: No results for input(s): INR, PROTIME in the last 168  hours. Cardiac Enzymes: Recent Labs  Lab 12/27/17 1856  TROPONINI 0.06*   BNP (last 3 results) No results for input(s): PROBNP in the last 8760 hours. HbA1C: No results for input(s): HGBA1C in the last 72 hours. CBG: No results for input(s): GLUCAP in the last 168 hours. Lipid Profile: No results for input(s): CHOL, HDL, LDLCALC, TRIG, CHOLHDL, LDLDIRECT in the last 72 hours. Thyroid Function Tests: No results for input(s): TSH, T4TOTAL, FREET4, T3FREE, THYROIDAB in the last 72 hours. Anemia Panel: No results for input(s): VITAMINB12, FOLATE, FERRITIN, TIBC, IRON, RETICCTPCT in the last 72 hours. Urine analysis:    Component Value Date/Time   COLORURINE AMBER (A) 11/12/2017 1355   APPEARANCEUR HAZY (A) 11/12/2017 1355   LABSPEC 1.020 11/12/2017 1355   PHURINE 5.5 11/12/2017 1355   GLUCOSEU NEGATIVE 11/12/2017 1355   HGBUR MODERATE (A) 11/12/2017 1355   BILIRUBINUR NEGATIVE 11/12/2017 1355   KETONESUR 15 (A) 11/12/2017 1355   PROTEINUR 100 (A) 11/12/2017 1355   UROBILINOGEN 1.0 01/27/2015 0336   NITRITE NEGATIVE 11/12/2017 1355   LEUKOCYTESUR NEGATIVE 11/12/2017 1355    Radiological Exams on Admission: Dg Chest Portable 1 View  Result Date: 12/27/2017 CLINICAL DATA:  Respiratory distress. EXAM: PORTABLE CHEST 1 VIEW COMPARISON:  Chest radiograph November 09, 2017 FINDINGS: Cardiomediastinal silhouette is normal. Calcified aortic arch. Increased lung volumes with a focal bullous changes and bibasilar fibrotic scarring. Blunting of bilateral costophrenic angles. No pneumothorax. Soft tissue planes and included osseous structures are non suspicious. IMPRESSION: COPD ( Emphysema (ICD10-J43.9) with bibasilar fibrosis and small pleural effusions versus pleural thickening. Aortic Atherosclerosis (ICD10-I70.0). Electronically Signed   By: Awilda Metro M.D.   On: 12/27/2017 19:34    EKG: Independently reviewed.  Assessment/Plan Principal Problem:   COPD exacerbation  (HCC) Active Problems:   Acute on chronic respiratory failure with hypoxia (HCC)    1. COPD exacerbation - with acute respiratory failure with hypoxia 1. COPD pathway 2. BIPAP 3. Prednisone 4. Scheduled and PRN nebs 5. Rocephin  DVT prophylaxis: Lovenox Code Status: DNR - confirmed with Son Family Communication: Son at bedside Disposition Plan: Home after admit Consults called: None Admission status: Admit to inpatient - IP status due to acute respiratory failure with hypoxia, requiring rescue BIPAP.   Hillary Bow DO [facility] Pager [phone] Only works nights!  If 7AM-7PM, please contact the primary day team physician taking care of patient  [email or web address] Password [email or web address]  12/27/2017, 9:13 PM

## 2017-12-27 NOTE — Progress Notes (Signed)
Patient transported from ED to 2W on BiPap without incident.  Vitals stable throughout.

## 2017-12-27 NOTE — ED Provider Notes (Signed)
[facility] EMERGENCY DEPARTMENT Provider Note   CSN: [id number] Arrival date & time: 12/27/17  4034     History   Chief Complaint Chief Complaint  Patient presents with  . Respiratory Distress    HPI Jeremy Brewer is a 84 y.o. male.  Pt presents to the ED today with sob.  The pt has a hx of COPD.  He has been sob for 7 days.  EMS gave pt 125 mg of solumedrol IV, 10 mg albuterol, and 1 mg of atrovent en route.  Pt has improved a little bit.  EMS said RA O2 sats were in the 80s.  The pt is unable to give much hx due to sob.     Past Medical History:  Diagnosis Date  . COPD (chronic obstructive pulmonary disease) (HCC)   . Medical history non-contributory     Patient Active Problem List   Diagnosis Date Noted  . COPD exacerbation (HCC) 12/27/2017  . Chronic respiratory failure with hypoxia (HCC) 04/26/2017  . Lung nodules 04/26/2017  . Acute on chronic respiratory failure (HCC) 03/23/2017  . Acute on chronic respiratory failure with hypoxia (HCC) 03/22/2017  . Sepsis (HCC) 01/27/2015  . Community acquired pneumonia 01/27/2015  . COPD (chronic obstructive pulmonary disease) (HCC) 01/27/2015  . Polycythemia 01/27/2015  . Tobacco abuse 01/27/2015    Past Surgical History:  Procedure Laterality Date  . APPENDECTOMY          Home Medications    Prior to Admission medications   Medication Sig Start Date End Date Taking? Authorizing Provider  albuterol (PROVENTIL HFA;VENTOLIN HFA) 108 (90 Base) MCG/ACT inhaler Inhale 2 puffs into the lungs every 6 (six) hours as needed for wheezing or shortness of breath. 04/26/17   Parrett, Tammy S, NP  ipratropium-albuterol (DUONEB) 0.5-2.5 (3) MG/3ML SOLN Take 3 mLs by nebulization every 6 (six) hours as needed. 03/23/17   Meredeth Ide, MD  OXYGEN Inhale into the lungs.    [provider]  umeclidinium-vilanterol (ANORO ELLIPTA) 62.5-25 MCG/INH AEPB Inhale 1 puff into the lungs daily. 04/26/17   Parrett,  Virgel Bouquet, NP    Family History Family History  Problem Relation Age of Onset  . Diabetes Mellitus II Neg Hx   . CAD Neg Hx     Social History Social History   Tobacco Use  . Smoking status: Former Smoker    Packs/day: 2.00    Types: Cigarettes    Last attempt to quit: 2016    Years since quitting: 3.7  . Smokeless tobacco: Never Used  Substance Use Topics  . Alcohol use: No  . Drug use: No     Allergies   Patient has no known allergies.   Review of Systems Review of Systems  Respiratory: Positive for shortness of breath and wheezing.   All other systems reviewed and are negative.    Physical Exam Updated Vital Signs BP 105/76   Pulse (!) 110   Temp 97.7 F (36.5 C) (Temporal)   Resp (!) 30   Ht 5\' 10"  (1.778 m)   Wt 70 kg   SpO2 98%   BMI 22.14 kg/m   Physical Exam  Constitutional: He is oriented to person, place, and time. He appears distressed.  HENT:  Head: Normocephalic and atraumatic.  Right Ear: External ear normal.  Left Ear: External ear normal.  Nose: Nose normal.  Mouth/Throat: Oropharynx is clear and moist.  Eyes: Pupils are equal, round, and reactive to light. Conjunctivae and EOM  are normal.  Neck: Normal range of motion. Neck supple.  Cardiovascular: Regular rhythm. Tachycardia present.  Pulmonary/Chest: Accessory muscle usage present. Tachypnea noted. He is in respiratory distress. He has wheezes.  Abdominal: Soft. Bowel sounds are normal.  Musculoskeletal: Normal range of motion.  Neurological: He is alert and oriented to person, place, and time.  Skin: Skin is warm. Capillary refill takes less than 2 seconds.  Psychiatric: He has a normal mood and affect. His behavior is normal. Judgment and thought content normal.  Nursing note and vitals reviewed.    ED Treatments / Results  Labs (all labs ordered are listed, but only abnormal results are displayed) Labs Reviewed  COMPREHENSIVE METABOLIC PANEL - Abnormal; Notable for the  following components:      Result Value   Glucose, Bld 134 (*)    BUN 26 (*)    Creatinine, Ser 1.54 (*)    Total Bilirubin 1.4 (*)    GFR calc non Af Amer 40 (*)    GFR calc Af Amer 46 (*)    All other components within normal limits  CBC WITH DIFFERENTIAL/PLATELET - Abnormal; Notable for the following components:   WBC 15.3 (*)    Neutro Abs 9.0 (*)    Monocytes Absolute 2.3 (*)    Basophils Absolute 0.2 (*)    All other components within normal limits  TROPONIN I - Abnormal; Notable for the following components:   Troponin I 0.06 (*)    All other components within normal limits  BRAIN NATRIURETIC PEPTIDE - Abnormal; Notable for the following components:   B Natriuretic Peptide 178.8 (*)    All other components within normal limits  I-STAT ARTERIAL BLOOD GAS, ED - Abnormal; Notable for the following components:   pH, Arterial 7.301 (*)    pCO2 arterial 49.3 (*)    Acid-base deficit 3.0 (*)    All other components within normal limits  I-STAT CG4 LACTIC ACID, ED - Abnormal; Notable for the following components:   Lactic Acid, Venous 3.10 (*)    All other components within normal limits  CULTURE, BLOOD (ROUTINE X 2)  CULTURE, BLOOD (ROUTINE X 2)  HIV ANTIBODY (ROUTINE TESTING W REFLEX)  CBC  BASIC METABOLIC PANEL  I-STAT CG4 LACTIC ACID, ED    EKG EKG Interpretation  Date/Time:  Thursday December 27 2017 18:52:21 EDT Ventricular Rate:  133 PR Interval:    QRS Duration: 102 QT Interval:  301 QTC Calculation: 448 R Axis:   154 Text Interpretation:  Ectopic atrial tachycardia, unifocal Inferior infarct, old Probable anterolateral infarct, old Since last tracing rate faster Confirmed by Jacalyn Lefevre [phone]) on 12/27/2017 7:06:31 PM   Radiology Dg Chest Portable 1 View  Result Date: 12/27/2017 CLINICAL DATA:  Respiratory distress. EXAM: PORTABLE CHEST 1 VIEW COMPARISON:  Chest radiograph November 09, 2017 FINDINGS: Cardiomediastinal silhouette is normal. Calcified  aortic arch. Increased lung volumes with a focal bullous changes and bibasilar fibrotic scarring. Blunting of bilateral costophrenic angles. No pneumothorax. Soft tissue planes and included osseous structures are non suspicious. IMPRESSION: COPD ( Emphysema (ICD10-J43.9) with bibasilar fibrosis and small pleural effusions versus pleural thickening. Aortic Atherosclerosis (ICD10-I70.0). Electronically Signed   By: Awilda Metro M.D.   On: 12/27/2017 19:34    Procedures Procedures (including critical care time)  Medications Ordered in ED Medications  azithromycin (ZITHROMAX) 500 mg in sodium chloride 0.9 % 250 mL IVPB (has no administration in time range)  sodium chloride 0.9 % bolus 1,000 mL (1,000 mLs Intravenous New  Bag/Given 12/27/17 2027)  predniSONE (DELTASONE) tablet 40 mg (has no administration in time range)  cefTRIAXone (ROCEPHIN) 1 g in sodium chloride 0.9 % 100 mL IVPB (has no administration in time range)  umeclidinium-vilanterol (ANORO ELLIPTA) 62.5-25 MCG/INH 1 puff (has no administration in time range)  albuterol (PROVENTIL) (2.5 MG/3ML) 0.083% nebulizer solution 2.5 mg (has no administration in time range)  ipratropium-albuterol (DUONEB) 0.5-2.5 (3) MG/3ML nebulizer solution 3 mL (has no administration in time range)  0.9 %  sodium chloride infusion (has no administration in time range)  enoxaparin (LOVENOX) injection 40 mg (has no administration in time range)  albuterol (PROVENTIL) (2.5 MG/3ML) 0.083% nebulizer solution 2.5 mg (2.5 mg Nebulization Given 12/27/17 1929)  magnesium sulfate IVPB 2 g 50 mL (0 g Intravenous Stopped 12/27/17 2005)  cefTRIAXone (ROCEPHIN) 1 g in sodium chloride 0.9 % 100 mL IVPB (1 g Intravenous New Bag/Given 12/27/17 2024)     Initial Impression / Assessment and Plan / ED Course  I have reviewed the triage vital signs and the nursing notes.  Pertinent labs & imaging results that were available during my care of the patient were reviewed by me and  considered in my medical decision making (see chart for details).    Pt immediately placed on bipap upon arrival here.  Bipap has helped him quite a bit.  He was given additional nebs and magnesium.  He does not have pna on cxr, but he will be started on rocephin/zithromax due to resp sx.  Pt d/w Dr. Julian Reil ([facility]) for admission.  CRITICAL CARE Performed by: Jacalyn Lefevre   Total critical care time: 30 minutes  Critical care time was exclusive of separately billable procedures and treating other patients.  Critical care was necessary to treat or prevent imminent or life-threatening deterioration.  Critical care was time spent personally by me on the following activities: development of treatment plan with patient and/or surrogate as well as nursing, discussions with consultants, evaluation of patient's response to treatment, examination of patient, obtaining history from patient or surrogate, ordering and performing treatments and interventions, ordering and review of laboratory studies, ordering and review of radiographic studies, pulse oximetry and re-evaluation of patient's condition.  Final Clinical Impressions(s) / ED Diagnoses   Final diagnoses:  COPD exacerbation (HCC)  Acute respiratory failure with hypoxia [facility])    ED Discharge Orders    None       Jacalyn Lefevre, MD 12/27/17 2111

## 2017-12-27 NOTE — ED Triage Notes (Signed)
Pt brought in by EMS due to being in respiratory distress. Pt has hx of COPD. Pt has had SOB x7 days. Pt received 125mg  of solumedrol, 10mg  of albuterol, and 1mg  of atrovent.

## 2017-12-28 DIAGNOSIS — N179 Acute kidney failure, unspecified: Secondary | ICD-10-CM

## 2017-12-28 LAB — RESPIRATORY PANEL BY PCR
Adenovirus: NOT DETECTED
Bordetella pertussis: NOT DETECTED
CORONAVIRUS 229E-RVPPCR: NOT DETECTED
Chlamydophila pneumoniae: NOT DETECTED
Coronavirus HKU1: NOT DETECTED
Coronavirus NL63: NOT DETECTED
Coronavirus OC43: NOT DETECTED
INFLUENZA A-RVPPCR: NOT DETECTED
INFLUENZA B-RVPPCR: NOT DETECTED
METAPNEUMOVIRUS-RVPPCR: NOT DETECTED
Mycoplasma pneumoniae: NOT DETECTED
PARAINFLUENZA VIRUS 2-RVPPCR: NOT DETECTED
PARAINFLUENZA VIRUS 4-RVPPCR: NOT DETECTED
Parainfluenza Virus 1: NOT DETECTED
Parainfluenza Virus 3: NOT DETECTED
RESPIRATORY SYNCYTIAL VIRUS-RVPPCR: NOT DETECTED
Rhinovirus / Enterovirus: DETECTED — AB

## 2017-12-28 LAB — URINALYSIS, ROUTINE W REFLEX MICROSCOPIC
Bacteria, UA: NONE SEEN
Bilirubin Urine: NEGATIVE
Glucose, UA: NEGATIVE mg/dL
Ketones, ur: NEGATIVE mg/dL
Leukocytes, UA: NEGATIVE
Nitrite: NEGATIVE
PH: 6 (ref 5.0–8.0)
Protein, ur: 30 mg/dL — AB
Specific Gravity, Urine: 1.02 (ref 1.005–1.030)

## 2017-12-28 LAB — MRSA PCR SCREENING: MRSA BY PCR: NEGATIVE

## 2017-12-28 LAB — BASIC METABOLIC PANEL
Anion gap: 10 (ref 5–15)
BUN: 30 mg/dL — ABNORMAL HIGH (ref 8–23)
CALCIUM: 8.5 mg/dL — AB (ref 8.9–10.3)
CO2: 24 mmol/L (ref 22–32)
CREATININE: 1.67 mg/dL — AB (ref 0.61–1.24)
Chloride: 101 mmol/L (ref 98–111)
GFR calc Af Amer: 42 mL/min — ABNORMAL LOW (ref >60)
GFR calc non Af Amer: 36 mL/min — ABNORMAL LOW (ref >60)
GLUCOSE: 187 mg/dL — AB (ref 70–99)
POTASSIUM: 3.6 mmol/L (ref 3.5–5.1)
Sodium: 135 mmol/L (ref 135–145)

## 2017-12-28 LAB — LACTIC ACID, PLASMA
LACTIC ACID, VENOUS: 1.2 mmol/L (ref 0.5–1.9)
Lactic Acid, Venous: 2.3 mmol/L (ref 0.5–1.9)
Lactic Acid, Venous: 2.6 mmol/L (ref 0.5–1.9)

## 2017-12-28 LAB — CBC
HCT: 42.1 % (ref 39.0–52.0)
Hemoglobin: 13.8 g/dL (ref 13.0–17.0)
MCH: 29.5 pg (ref 26.0–34.0)
MCHC: 32.8 g/dL (ref 30.0–36.0)
MCV: 90 fL (ref 78.0–100.0)
PLATELETS: 251 10*3/uL (ref 150–400)
RBC: 4.68 MIL/uL (ref 4.22–5.81)
RDW: 12.7 % (ref 11.5–15.5)
WBC: 9 10*3/uL (ref 4.0–10.5)

## 2017-12-28 LAB — HIV ANTIBODY (ROUTINE TESTING W REFLEX): HIV Screen 4th Generation wRfx: NONREACTIVE

## 2017-12-28 MED ORDER — IPRATROPIUM-ALBUTEROL 0.5-2.5 (3) MG/3ML IN SOLN
3.0000 mL | Freq: Three times a day (TID) | RESPIRATORY_TRACT | Status: DC
  Administered 2017-12-28 – 2017-12-30 (×7): 3 mL via RESPIRATORY_TRACT
  Filled 2017-12-28 (×7): qty 3

## 2017-12-28 MED ORDER — AZITHROMYCIN 250 MG PO TABS
500.0000 mg | ORAL_TABLET | Freq: Every day | ORAL | Status: DC
  Administered 2017-12-28 – 2017-12-30 (×3): 500 mg via ORAL
  Filled 2017-12-28 (×3): qty 2

## 2017-12-28 MED ORDER — BUDESONIDE 0.25 MG/2ML IN SUSP
0.2500 mg | Freq: Two times a day (BID) | RESPIRATORY_TRACT | Status: DC
  Administered 2017-12-28 – 2017-12-30 (×4): 0.25 mg via RESPIRATORY_TRACT
  Filled 2017-12-28 (×4): qty 2

## 2017-12-28 MED ORDER — ORAL CARE MOUTH RINSE
15.0000 mL | Freq: Two times a day (BID) | OROMUCOSAL | Status: DC
  Administered 2017-12-29: 15 mL via OROMUCOSAL

## 2017-12-28 MED ORDER — CHLORHEXIDINE GLUCONATE 0.12 % MT SOLN
15.0000 mL | Freq: Two times a day (BID) | OROMUCOSAL | Status: DC
  Administered 2017-12-28 – 2017-12-30 (×5): 15 mL via OROMUCOSAL
  Filled 2017-12-28 (×5): qty 15

## 2017-12-28 MED ORDER — SODIUM CHLORIDE 0.9 % IV BOLUS
500.0000 mL | Freq: Once | INTRAVENOUS | Status: AC
  Administered 2017-12-28: 500 mL via INTRAVENOUS

## 2017-12-28 MED ORDER — SODIUM CHLORIDE 0.9 % IV BOLUS: 500.0000 mL | Freq: Once | INTRAVENOUS | Status: DC

## 2017-12-28 NOTE — Evaluation (Signed)
Physical Therapy Evaluation Patient Details Name: Jeremy Brewer MRN: [id number] DOB: 07-Jul-1930 Today's Date: 12/28/2017   History of Present Illness  Pt is a 84 y.o. male with medical history significant of COPD on 3L O2 at baseline. 7 day history of worsening SOB, progressing to respiratory distress. EMS transported to ED.   Clinical Impression  PTA pt lived with his wife and his step son and their family in a single story home with no steps to enter. Pt was independent without AD, able to assist in yard work and independent in [employer]. Pt currently limited by oxygen desaturation (see General Comments) and generalized deconditioning. Pt is independent with bed mobility, and requires supervision for transfers and min guard for ambulation of 10 feet without AD. PT recommends HHPT level rehab at d/c to work on energy conservation and strengthening to safely mobilize in his home environment. PT will continue to follow acutely.     Follow Up Recommendations Home health PT;Supervision/Assistance - 24 hour    Equipment Recommendations  None recommended by PT    Recommendations for Other Services       Precautions / Restrictions Precautions Precautions: None Restrictions Weight Bearing Restrictions: No      Mobility  Bed Mobility Overal bed mobility: Independent                Transfers Overall transfer level: Needs assistance   Transfers: Sit to/from Stand Sit to Stand: Supervision         General transfer comment: supervision for safety, good power up and steadying  Ambulation/Gait Ambulation/Gait assistance: Min guard Gait Distance (Feet): 10 Feet Assistive device: None Gait Pattern/deviations: Step-through pattern;Shuffle Gait velocity: slowed Gait velocity interpretation: <1.31 ft/sec, indicative of household ambulator General Gait Details: min guard, pt ambulated 5 feet and had LoB, he was able to self correct, but stated "I'm too weak to do this today." and  returned to chair without assist.      Balance Overall balance assessment: Needs assistance Sitting-balance support: Feet supported;No upper extremity supported Sitting balance-Leahy Scale: Normal     Standing balance support: No upper extremity supported;During functional activity Standing balance-Leahy Scale: Fair                               Pertinent Vitals/Pain Pain Assessment: No/denies pain    Home Living Family/patient expects to be discharged to:: Private residence Living Arrangements: Spouse/significant other;Other relatives(step-son and family) Available Help at Discharge: Family;Available 24 hours/day Type of Home: House Home Access: Level entry     Home Layout: One level Home Equipment: Grab bars - tub/shower;Tub bench;Cane - single point      Prior Function Level of Independence: Independent         Comments: no AD, does yard work, no assist with iADLs        Extremity/Trunk Assessment   Upper Extremity Assessment Upper Extremity Assessment: Overall WFL for tasks assessed    Lower Extremity Assessment Lower Extremity Assessment: Overall WFL for tasks assessed       Communication   Communication: HOH  Cognition Arousal/Alertness: Awake/alert Behavior During Therapy: WFL for tasks assessed/performed Overall Cognitive Status: Within Functional Limits for tasks assessed                                        General Comments General comments (skin integrity, edema,  etc.): Pt on 5L O2 via [place] at rest SaO2 96%O2, with ambulation of 5 feet SaO2 dropped to 81%O2, returned to seated and within minute of self directed pursed lipped breathing SaO2 rebounded to 92%O2        Assessment/Plan    PT Assessment Patient needs continued PT services  PT Problem List Decreased strength;Decreased activity tolerance;Cardiopulmonary status limiting activity       PT Treatment Interventions DME instruction;Gait  training;Functional mobility training;Therapeutic activities;Therapeutic exercise;Balance training;Cognitive remediation;Patient/family education    PT Goals (Current goals can be found in the Care Plan section)  Acute Rehab PT Goals Patient Stated Goal: breathe easier PT Goal Formulation: With patient Time For Goal Achievement: 01/11/18 Potential to Achieve Goals: Good    Frequency Min 3X/week    AM-PAC PT "6 Clicks" Daily Activity  Outcome Measure Difficulty turning over in bed (including adjusting bedclothes, sheets and blankets)?: A Little Difficulty moving from lying on back to sitting on the side of the bed? : A Little Difficulty sitting down on and standing up from a chair with arms (e.g., wheelchair, bedside commode, etc,.)?: A Little Help needed moving to and from a bed to chair (including a wheelchair)?: A Little Help needed walking in hospital room?: A Little Help needed climbing 3-5 steps with a railing? : A Lot 6 Click Score: 17    End of Session Equipment Utilized During Treatment: Gait belt;Oxygen Activity Tolerance: Patient limited by fatigue Patient left: in chair;with call bell/phone within reach;with chair alarm set Nurse Communication: Mobility status PT Visit Diagnosis: Unsteadiness on feet (R26.81);Other abnormalities of gait and mobility (R26.89);Muscle weakness (generalized) (M62.81);Difficulty in walking, not elsewhere classified (R26.2)    Time: 4210-3128 PT Time Calculation (min) (ACUTE ONLY): 30 min   Charges:   PT Evaluation $PT Eval Moderate Complexity: 1 Mod PT Treatments $Gait Training: 8-22 mins        Shelita Steptoe B. Beverely Risen PT, DPT Acute Rehabilitation Services Pager [phone] Office [phone]   Elon Alas Fleet 12/28/2017, 9:43 AM

## 2017-12-28 NOTE — Progress Notes (Signed)
Nutrition Consult/Brief Note  RD consulted via COPD Exacerbation Order Set.  Wt Readings from Last 15 Encounters:  12/27/17 64.1 kg  11/12/17 69.9 kg  11/01/17 70.8 kg  06/04/17 64.9 kg  04/19/17 61.2 kg  03/22/17 61.4 kg  01/27/15 57.5 kg   Body mass index is 22.13 kg/m. Patient meets criteria for Normal based on current BMI.   Current diet order is Regular, patient is consuming approximately 100% of meals at this time. Labs and medications reviewed.   No nutrition interventions warranted at this time. If nutrition issues arise, please consult RD.   Maureen Chatters, RD, LDN Pager #: [phone] After-Hours Pager #: [phone]

## 2017-12-28 NOTE — Progress Notes (Signed)
CRITICAL VALUE ALERT  Critical Value:  Lactic acid 2.3  Date & Time Notied:  12/28/2017 0118  Provider Notified: Kirtland Bouchard Schorr  Orders Received/Actions taken: orders made

## 2017-12-28 NOTE — Progress Notes (Signed)
PROGRESS NOTE  Attending MD note  Patient was seen, examined,treatment plan was discussed with the PA-S.  I have personally reviewed the clinical findings, lab, imaging studies and management of this patient in detail. I agree with the documentation, as recorded by the PA-S  Patient is a pleasant 84 year old male with history of COPD on chronic home oxygen 3 L nasal cannula, who presents to the ED after a 7-day URI illness which is not improved, he became progressively more short of breath and decided come to the hospital.  He had significant wheezing and hypoxia on admission, required placing on BiPAP and he was admitted to stepdown.  He was placed on steroids as well as antibiotics.  BP (!) 154/71   Pulse 99   Temp 98.1 F (36.7 C) (Oral)   Resp 16   Ht 5\' 7"  (1.702 m)   Wt 64.1 kg   SpO2 96%   BMI 22.13 kg/m  On Exam: Gen. exam: He is awake, alert, does appear tachypneic with pursed lip breathing Chest: Overall decreased breath sounds, bilateral wheezing present, increased respiratory effort and tachypneic CVS: S1-S2 regular, no murmurs Abdomen: Soft, nontender and nondistended Neurology: Non-focal Skin: No rash or lesions  Plan  Acute on chronic hypoxic and hypercarbic respiratory failure in the setting of COPD exacerbation due to rhinovirus bronchiolitis -Required BiPAP on admission, currently stable on nasal cannula however does appear to have increased work of breathing with high risk of decompensation, will keep in stepdown and use BiPAP as needed -Continue steroids, given productive cough continue antibiotics, continue Pulmicort, duo nebs  Acute kidney injury -Prior normal renal function, no NSAID use in the last 7 days, obtain a urinalysis, increase rate of IV fluids, do a bladder scan to rule out obstructive process -Recheck BMP in the morning  Intermittent hypotension -Unclear whether true hypotension, had a read at 89/69 and another one at 75/59, however these are  interlaced with pressures in the 150s.  Patient is asymptomatic and generally nodding off and relaxing when he had low reads, but will continue to monitor closely   Rest as below  Destanee Bedonie M. Elvera Lennox, MD [facility] [phone]  If 7PM-7AM, please contact night-coverage [email or web address] Password [email or web address]    Jeremy Brewer UJW:[id number] DOB: [birth year] DOA: 12/27/2017 PCP: Joycelyn Rua, MD   LOS: 1 day   Brief Narrative / Interim history: Jeremy Brewer is a 84 y/o male with medical history significant for COPD on 3L [place] O2 at home, who presented to the ED 10/3 by EMS after developing respiratory distress from worsening SOB for the last 7 days. On arrival he was tachycardic and tachypneic with wheezing, after receiving 125 mg solumedrol IV, 10 mg albuterol, and 1 mg atrovent per EMS, and immediately placed on BiPAP. CXR showed COPD with fibrosis. Labs significant for WBC 15.3, Lactate 3.1. Initially, he received IVF bolus and magnesium and was started on prednisone, azithromycin and rocephin due to respiratory symptoms as well as nebs. He was admitted for further management of acute on chronic hypoxic respiratory failure in the setting of COPD exacerbation.   Subjective: Patients says his breathing has improved and is "adequate", says he normally coughs a few times a day, but recently has been a few times an hour. Denies chest pain, abdominal pain, fever/chills, LE edema.   Assessment & Plan: Principal Problem:   COPD exacerbation (HCC) Active Problems:   Acute on chronic respiratory failure with hypoxia (HCC)  Acute on chronic hypoxic respiratory failure in the setting of  COPD exacerbation - Currently on 3L [place] oxygen, however remains tachypneic with increased breathing effort with pursed lips breathing - Continue PO prednisone, bronchodilators and neb treatments - Respiratory panel positive for Rhinovirus, blood cultures with no growth thus far - Continue Abx coverage in the setting of  COPD exacerbation with productive cough  - PT recommend home health PT upon discharge  AKI, likely prerenal - Patient reports eating and drinking less since feeling ill with decreased urination, no NSAID use or history of obstruction or prostate problems - On admission, Cr 1.54 and increased to 1.67 - Will increase IVF from 75 mL/hr to 125 mL/hr, recheck BMP in am   Scheduled Meds: . chlorhexidine  15 mL Mouth Rinse BID  . enoxaparin (LOVENOX) injection  40 mg Subcutaneous Q24H  . ipratropium-albuterol  3 mL Nebulization TID  . mouth rinse  15 mL Mouth Rinse q12n4p  . predniSONE  40 mg Oral Q breakfast  . umeclidinium-vilanterol  1 puff Inhalation Daily   Continuous Infusions: . sodium chloride 125 mL/hr (12/28/17 1024)  . cefTRIAXone (ROCEPHIN)  IV     PRN Meds:.albuterol  DVT prophylaxis: Lovenox Code Status: DNR Family Communication: none at bedside Disposition Plan: home pending clinical improvement  Consultants:   none  Procedures:   BiPAP: 10/3  Antimicrobials:  Azithromycin, ceftriaxone 10/3 >>  Objective: Vitals:   12/28/17 0540 12/28/17 0758 12/28/17 0831 12/28/17 0834  BP:  132/68    Pulse:  85    Resp:  18    Temp:  97.9 F (36.6 C)    TempSrc:  Oral    SpO2: 94% 97% 92% 90%  Weight:      Height:        Intake/Output Summary (Last 24 hours) at 12/28/2017 1144 Last data filed at 12/28/2017 0913 Gross per 24 hour  Intake 2735.28 ml  Output 175 ml  Net 2560.28 ml   Filed Weights   12/27/17 1858 12/27/17 2215  Weight: 70 kg 64.1 kg    Examination:  Constitutional: Sitting up in bed somewhat uncomfortable, NAD Respiratory: mild bilateral expiratory wheezing and decreased breath sounds, respiratory effort with pursed lips, tachypneic and with increased respiratory effort Cardiovascular: regular rate and rhythm, no murmurs Abdomen: soft, non tender, non distended  Musculoskeletal: no clubbing or cyanosis Skin: no rashes, lesions, ulcers  seen Neurologic: alert and oriented x3, no focal  Psychiatric: normal mood and affect    Data Reviewed: I have independently reviewed following labs and imaging studies   Chest x-ray 10/3 personally reviewed, significant COPD changes with flattening of the diaphragms and increased lung fields EKG-sinus rhythm  CBC: Recent Labs  Lab 12/27/17 1856 12/28/17 0008  WBC 15.3* 9.0  NEUTROABS 9.0*  --   HGB 15.7 13.8  HCT 50.2 42.1  MCV 91.1 90.0  PLT 331 251   Basic Metabolic Panel: Recent Labs  Lab 12/27/17 1856 12/28/17 0008  NA 136 135  K 4.3 3.6  CL 101 101  CO2 23 24  GLUCOSE 134* 187*  BUN 26* 30*  CREATININE 1.54* 1.67*  CALCIUM 9.0 8.5*   GFR: Estimated Creatinine Clearance: 29.9 mL/min (A) (by C-G formula based on SCr of 1.67 mg/dL (H)). Liver Function Tests: Recent Labs  Lab 12/27/17 1856  AST 24  ALT 14  ALKPHOS 51  BILITOT 1.4*  PROT 7.2  ALBUMIN 3.5   No results for input(s): LIPASE, AMYLASE in the last 168 hours. No results for input(s): AMMONIA in the last 168 hours. Coagulation  Profile: No results for input(s): INR, PROTIME in the last 168 hours. Cardiac Enzymes: Recent Labs  Lab 12/27/17 1856  TROPONINI 0.06*   BNP (last 3 results) No results for input(s): PROBNP in the last 8760 hours. HbA1C: No results for input(s): HGBA1C in the last 72 hours. CBG: No results for input(s): GLUCAP in the last 168 hours. Lipid Profile: No results for input(s): CHOL, HDL, LDLCALC, TRIG, CHOLHDL, LDLDIRECT in the last 72 hours. Thyroid Function Tests: No results for input(s): TSH, T4TOTAL, FREET4, T3FREE, THYROIDAB in the last 72 hours. Anemia Panel: No results for input(s): VITAMINB12, FOLATE, FERRITIN, TIBC, IRON, RETICCTPCT in the last 72 hours. Urine analysis:    Component Value Date/Time   COLORURINE AMBER (A) 11/12/2017 1355   APPEARANCEUR HAZY (A) 11/12/2017 1355   LABSPEC 1.020 11/12/2017 1355   PHURINE 5.5 11/12/2017 1355   GLUCOSEU  NEGATIVE 11/12/2017 1355   HGBUR MODERATE (A) 11/12/2017 1355   BILIRUBINUR NEGATIVE 11/12/2017 1355   KETONESUR 15 (A) 11/12/2017 1355   PROTEINUR 100 (A) 11/12/2017 1355   UROBILINOGEN 1.0 01/27/2015 0336   NITRITE NEGATIVE 11/12/2017 1355   LEUKOCYTESUR NEGATIVE 11/12/2017 1355   Sepsis Labs: Invalid input(s): PROCALCITONIN, LACTICIDVEN  Recent Results (from the past 240 hour(s))  Culture, blood (routine x 2)     Status: None (Preliminary result)   Collection Time: 12/27/17  7:00 PM  Result Value Ref Range Status   Specimen Description BLOOD LEFT HAND  Final   Special Requests   Final    BOTTLES DRAWN AEROBIC ONLY Blood Culture adequate volume   Culture   Final    NO GROWTH < 12 HOURS Performed at [facility], [address], [place], [place] [address]    Report Status PENDING  Incomplete  Culture, blood (routine x 2)     Status: None (Preliminary result)   Collection Time: 12/27/17  7:05 PM  Result Value Ref Range Status   Specimen Description BLOOD BLOOD RIGHT FOREARM  Final   Special Requests   Final    BOTTLES DRAWN AEROBIC AND ANAEROBIC Blood Culture adequate volume   Culture   Final    NO GROWTH < 12 HOURS Performed at [facility], [address] [place], [place] [address]    Report Status PENDING  Incomplete  MRSA PCR Screening     Status: None   Collection Time: 12/28/17 12:27 AM  Result Value Ref Range Status   MRSA by PCR NEGATIVE NEGATIVE Final    Comment:        The GeneXpert MRSA Assay (FDA approved for NASAL specimens only), is one component of a comprehensive MRSA colonization surveillance program. It is not intended to diagnose MRSA infection nor to guide or monitor treatment for MRSA infections. Performed at [facility], [address] [place], [place] [address]       Radiology Studies: Dg Chest Portable 1 View  Result Date: 12/27/2017 CLINICAL DATA:  Respiratory distress. EXAM: PORTABLE CHEST 1 VIEW COMPARISON:   Chest radiograph November 09, 2017 FINDINGS: Cardiomediastinal silhouette is normal. Calcified aortic arch. Increased lung volumes with a focal bullous changes and bibasilar fibrotic scarring. Blunting of bilateral costophrenic angles. No pneumothorax. Soft tissue planes and included osseous structures are non suspicious. IMPRESSION: COPD ( Emphysema (ICD10-J43.9) with bibasilar fibrosis and small pleural effusions versus pleural thickening. Aortic Atherosclerosis (ICD10-I70.0). Electronically Signed   By: Awilda Metro M.D.   On: 12/27/2017 19:34     Carollee Herter DeFillipo, PA-S [facility]  If 7PM-7AM, please contact night-coverage [email or web address] Password [email or web address] 12/28/2017, 11:44 AM

## 2017-12-28 NOTE — Evaluation (Signed)
Occupational Therapy Evaluation Patient Details Name: Jeremy Brewer MRN: [id number] DOB: [birth year] Today's Date: 12/28/2017    History of Present Illness Pt is a 84 y.o. male with medical history significant of COPD on 3L O2 at baseline. 7 day history of worsening SOB, progressing to respiratory distress. EMS transported to ED.    Clinical Impression   This 84 yo male admitted with above presents to acute OT with decrease in O2 sats with activity , will benefit from one more session to go over energy conservation strategies from handout.    Follow Up Recommendations  No OT follow up;Supervision - Intermittent    Equipment Recommendations  None recommended by OT       Precautions / Restrictions Precautions Precautions: None Precaution Comments: monitor O2 sats (chart says pt wears 3 liters of O2 at home, but pt says he doesn't always wear it) Restrictions Weight Bearing Restrictions: No      Mobility Bed Mobility               General bed mobility comments: Pt up in recliner upon arrival  Transfers Overall transfer level: Needs assistance Equipment used: None Transfers: Sit to/from Stand Sit to Stand: Supervision         General transfer comment: S for managing O2 line        ADL either performed or assessed with clinical judgement   ADL                                         General ADL Comments: Pt at an overall S level for basic ADLs, he purse lip breaths without cues prn. He reports he does not always use his 3 liters of O2 at home--explained to him that he really needs to use it now due to drop into mid 80's on 3.5 liters with activity so he does not need to be just on RA at home--he verbalized understanding.     Vision Patient Visual Report: No change from baseline              Hand Dominance Right   Extremity/Trunk Assessment Upper Extremity Assessment Upper Extremity Assessment: Overall WFL for tasks assessed            Communication Communication Communication: HOH   Cognition Arousal/Alertness: Awake/alert Behavior During Therapy: WFL for tasks assessed/performed Overall Cognitive Status: Within Functional Limits for tasks assessed                                                Home Living Family/patient expects to be discharged to:: Private residence Living Arrangements: Spouse/significant other;Other relatives(step son and family ) Available Help at Discharge: Family;Available 24 hours/day Type of Home: House Home Access: Level entry     Home Layout: One level     Bathroom Shower/Tub: Tub/shower unit(pt states he only sponges bathes due to feeling too closed in in tub/shower combo)   Bathroom Toilet: Handicapped height Bathroom Accessibility: Yes              Prior Functioning/Environment          Comments: no AD, does yard work, no assist with basic ADLs, wife is disabled,         OT Problem List: Cardiopulmonary status limiting activity  OT Treatment/Interventions: Self-care/ADL training;Energy conservation    OT Goals(Current goals can be found in the care plan section) Acute Rehab OT Goals Patient Stated Goal: get back to normal routine soon OT Goal Formulation: With patient Time For Goal Achievement: 01/11/18  OT Frequency: Min 2X/week              AM-PAC PT "6 Clicks" Daily Activity     Outcome Measure Help from another person eating meals?: None Help from another person taking care of personal grooming?: None Help from another person toileting, which includes using toliet, bedpan, or urinal?: None Help from another person bathing (including washing, rinsing, drying)?: None Help from another person to put on and taking off regular upper body clothing?: None Help from another person to put on and taking off regular lower body clothing?: None 6 Click Score: 24   End of Session    Activity Tolerance: (to bathroom and back pt's O2  dropped to mid 80's on 3.5 liters and pt had to purse lip breath to recover) Patient left: in chair;with call bell/phone within reach;with chair alarm set  OT Visit Diagnosis: Other (comment)(drop in O2 sats with activity)                Time: 4098-1191 OT Time Calculation (min): 22 min Charges:  OT General Charges $OT Visit: 1 Visit OT Evaluation $OT Eval Moderate Complexity: 1 Mod  Ignacia Palma, OTR/L Acute [employer] [phone] Office [phone]

## 2017-12-28 NOTE — Care Management Note (Signed)
Case Management Note  Patient Details  Name: Shneur Ulrich MRN: [id number] Date of Birth: 1931/02/17  Subjective/Objective:    From home with wife, home oxygen with [employer] for 3 liters,  Presents with copd ex, per pt eval rec HHPT, NCM offered choice from [employer], he chose [facility]. Referral given to [facility] with [facility] . Soc will begin 24-48 hrs post dc.                 Action/Plan: DC home when ready.  Expected Discharge Date:                  Expected Discharge Plan:  Home w Home Health Services  In-House Referral:     Discharge planning Services  CM Consult  Post Acute Care Choice:  Home Health Choice offered to:  Patient  DME Arranged:    DME Agency:     HH Arranged:  PT HH Agency:  [facility]  Status of Service:  Completed, signed off  If discussed at Long Length of Stay Meetings, dates discussed:    Additional Comments:  Leone Haven, RN 12/28/2017, 3:44 PM

## 2017-12-28 NOTE — Progress Notes (Signed)
CRITICAL VALUE ALERT  Critical Value: Lactic acid 2.6  Date & Time Notied: 12/28/2017 2080  Provider Notified: Elvera Lennox  Orders Received/Actions taken:

## 2017-12-29 LAB — BASIC METABOLIC PANEL
Anion gap: 7 (ref 5–15)
BUN: 40 mg/dL — AB (ref 8–23)
CHLORIDE: 106 mmol/L (ref 98–111)
CO2: 27 mmol/L (ref 22–32)
Calcium: 8.6 mg/dL — ABNORMAL LOW (ref 8.9–10.3)
Creatinine, Ser: 1.51 mg/dL — ABNORMAL HIGH (ref 0.61–1.24)
GFR, EST AFRICAN AMERICAN: 47 mL/min — AB (ref >60)
GFR, EST NON AFRICAN AMERICAN: 41 mL/min — AB (ref >60)
Glucose, Bld: 123 mg/dL — ABNORMAL HIGH (ref 70–99)
POTASSIUM: 3.8 mmol/L (ref 3.5–5.1)
SODIUM: 140 mmol/L (ref 135–145)

## 2017-12-29 MED ORDER — SODIUM CHLORIDE 0.9 % IV SOLN
INTRAVENOUS | Status: AC
  Administered 2017-12-29: 12:00:00 via INTRAVENOUS

## 2017-12-29 NOTE — Progress Notes (Signed)
PROGRESS NOTE  Jeremy Brewer TWS:[id number] DOB: 09/10/1930 DOA: 12/27/2017 PCP: Joycelyn Rua, MD   LOS: 2 days   Brief Narrative / Interim history: 84 year old male with COPD on chronic 3 L at home who was admitted to the ED after a 7-day upper respiratory illness which is not improved.  He also has had increasing wheezing and shortness of breath requiring BiPAP on admission.  He was admitted to stepdown.  He was placed on steroids as well as antibiotics.  Subjective: -Doing better this morning, was able to work a little bit of physical therapy and still quite significantly short of breath with activity.  No chest pain, no nausea or vomiting  Assessment & Plan: Principal Problem:   COPD exacerbation (HCC) Active Problems:   Acute on chronic respiratory failure with hypoxia (HCC)   Acute on chronic hypoxic and hypercarbic respiratory failure in the setting of COPD exacerbation due to rhinovirus bronchiolitis -No longer needing BiPAP overnight last night, nasal cannula this morning, improving however still significantly dyspneic, continue steroids, antibiotics--narrow from IV to p.o. Today--, continue nebulizers  Acute kidney injury -Creatinine slightly improved today but still elevated compared to baseline.  We will continue IV fluids for another 10 hours -Not retaining, bladder scan yesterday postvoid only 50 cc -Repeat renal function in the morning  Intermittent hypotension -Unclear whether true hypotension, had a read at 89/69 and another one at 75/59, however these are interlaced with pressures in the 150s.  Patient is asymptomatic and generally nodding off and relaxing when he had low reads, but will continue to monitor closely  Scheduled Meds: . azithromycin  500 mg Oral Daily  . budesonide (PULMICORT) nebulizer solution  0.25 mg Nebulization BID  . chlorhexidine  15 mL Mouth Rinse BID  . enoxaparin (LOVENOX) injection  40 mg Subcutaneous Q24H  . ipratropium-albuterol  3  mL Nebulization TID  . mouth rinse  15 mL Mouth Rinse q12n4p  . predniSONE  40 mg Oral Q breakfast  . umeclidinium-vilanterol  1 puff Inhalation Daily   Continuous Infusions: . sodium chloride 75 mL/hr at 12/29/17 1222  . cefTRIAXone (ROCEPHIN)  IV 1 g (12/28/17 2044)   PRN Meds:.albuterol  DVT prophylaxis: Lovenox Code Status: DNR Family Communication: No family at bedside Disposition Plan: Home in better, likely tomorrow  Consultants:   None  Procedures:   NONE  Antimicrobials:  Subtraction/azithromycin 10/3 >> 10/5  Objective: Vitals:   12/29/17 0815 12/29/17 0816 12/29/17 0825 12/29/17 1337  BP:      Pulse:      Resp:      Temp:      TempSrc:      SpO2: 94% 95% 97% 95%  Weight:      Height:        Intake/Output Summary (Last 24 hours) at 12/29/2017 1420 Last data filed at 12/29/2017 1028 Gross per 24 hour  Intake 1100.47 ml  Output 1270 ml  Net -169.53 ml   Filed Weights   12/27/17 1858 12/27/17 2215  Weight: 70 kg 64.1 kg    Examination:  Constitutional: NAD Eyes: no scleral icterus  Respiratory: Overall distant breath sounds, improving and expiratory wheezing, no crackles heard Cardiovascular: Regular rate and rhythm, no murmurs heard.  No peripheral edema Abdomen: no tenderness. Bowel sounds positive.  Skin: no rashes Neurologic: CN 2-12 grossly intact. Strength 5/5 in all 4.  Psychiatric: Normal judgment and insight. Alert and oriented x 3. Normal mood.   Data Reviewed: I have independently reviewed following labs and  imaging studies   CBC: Recent Labs  Lab 12/27/17 1856 12/28/17 0008  WBC 15.3* 9.0  NEUTROABS 9.0*  --   HGB 15.7 13.8  HCT 50.2 42.1  MCV 91.1 90.0  PLT 331 251   Basic Metabolic Panel: Recent Labs  Lab 12/27/17 1856 12/28/17 0008 12/29/17 0254  NA 136 135 140  K 4.3 3.6 3.8  CL 101 101 106  CO2 23 24 27   GLUCOSE 134* 187* 123*  BUN 26* 30* 40*  CREATININE 1.54* 1.67* 1.51*  CALCIUM 9.0 8.5* 8.6*    GFR: Estimated Creatinine Clearance: 33 mL/min (A) (by C-G formula based on SCr of 1.51 mg/dL (H)). Liver Function Tests: Recent Labs  Lab 12/27/17 1856  AST 24  ALT 14  ALKPHOS 51  BILITOT 1.4*  PROT 7.2  ALBUMIN 3.5   No results for input(s): LIPASE, AMYLASE in the last 168 hours. No results for input(s): AMMONIA in the last 168 hours. Coagulation Profile: No results for input(s): INR, PROTIME in the last 168 hours. Cardiac Enzymes: Recent Labs  Lab 12/27/17 1856  TROPONINI 0.06*   BNP (last 3 results) No results for input(s): PROBNP in the last 8760 hours. HbA1C: No results for input(s): HGBA1C in the last 72 hours. CBG: No results for input(s): GLUCAP in the last 168 hours. Lipid Profile: No results for input(s): CHOL, HDL, LDLCALC, TRIG, CHOLHDL, LDLDIRECT in the last 72 hours. Thyroid Function Tests: No results for input(s): TSH, T4TOTAL, FREET4, T3FREE, THYROIDAB in the last 72 hours. Anemia Panel: No results for input(s): VITAMINB12, FOLATE, FERRITIN, TIBC, IRON, RETICCTPCT in the last 72 hours. Urine analysis:    Component Value Date/Time   COLORURINE YELLOW 12/28/2017 2329   APPEARANCEUR HAZY (A) 12/28/2017 2329   LABSPEC 1.020 12/28/2017 2329   PHURINE 6.0 12/28/2017 2329   GLUCOSEU NEGATIVE 12/28/2017 2329   HGBUR MODERATE (A) 12/28/2017 2329   BILIRUBINUR NEGATIVE 12/28/2017 2329   KETONESUR NEGATIVE 12/28/2017 2329   PROTEINUR 30 (A) 12/28/2017 2329   UROBILINOGEN 1.0 01/27/2015 0336   NITRITE NEGATIVE 12/28/2017 2329   LEUKOCYTESUR NEGATIVE 12/28/2017 2329   Sepsis Labs: Invalid input(s): PROCALCITONIN, LACTICIDVEN  Recent Results (from the past 240 hour(s))  Culture, blood (routine x 2)     Status: None (Preliminary result)   Collection Time: 12/27/17  7:00 PM  Result Value Ref Range Status   Specimen Description BLOOD LEFT HAND  Final   Special Requests   Final    BOTTLES DRAWN AEROBIC ONLY Blood Culture adequate volume   Culture    Final    NO GROWTH < 24 HOURS Performed at [facility], [address] [place], [place] [address]    Report Status PENDING  Incomplete  Culture, blood (routine x 2)     Status: None (Preliminary result)   Collection Time: 12/27/17  7:05 PM  Result Value Ref Range Status   Specimen Description BLOOD BLOOD RIGHT FOREARM  Final   Special Requests   Final    BOTTLES DRAWN AEROBIC AND ANAEROBIC Blood Culture adequate volume   Culture   Final    NO GROWTH < 24 HOURS Performed at [facility], [address] [place], [place] [address]    Report Status PENDING  Incomplete  MRSA PCR Screening     Status: None   Collection Time: 12/28/17 12:27 AM  Result Value Ref Range Status   MRSA by PCR NEGATIVE NEGATIVE Final    Comment:  The GeneXpert MRSA Assay (FDA approved for NASAL specimens only), is one component of a comprehensive MRSA colonization surveillance program. It is not intended to diagnose MRSA infection nor to guide or monitor treatment for MRSA infections. Performed at [facility], [address], [place], [place] [address]   Respiratory Panel by PCR     Status: Abnormal   Collection Time: 12/28/17  6:33 AM  Result Value Ref Range Status   Adenovirus NOT DETECTED NOT DETECTED Final   Coronavirus 229E NOT DETECTED NOT DETECTED Final   Coronavirus HKU1 NOT DETECTED NOT DETECTED Final   Coronavirus NL63 NOT DETECTED NOT DETECTED Final   Coronavirus OC43 NOT DETECTED NOT DETECTED Final   Metapneumovirus NOT DETECTED NOT DETECTED Final   Rhinovirus / Enterovirus DETECTED (A) NOT DETECTED Final   Influenza A NOT DETECTED NOT DETECTED Final   Influenza B NOT DETECTED NOT DETECTED Final   Parainfluenza Virus 1 NOT DETECTED NOT DETECTED Final   Parainfluenza Virus 2 NOT DETECTED NOT DETECTED Final   Parainfluenza Virus 3 NOT DETECTED NOT DETECTED Final   Parainfluenza Virus 4 NOT DETECTED NOT DETECTED Final   Respiratory Syncytial Virus NOT DETECTED  NOT DETECTED Final   Bordetella pertussis NOT DETECTED NOT DETECTED Final   Chlamydophila pneumoniae NOT DETECTED NOT DETECTED Final   Mycoplasma pneumoniae NOT DETECTED NOT DETECTED Final    Comment: Performed at [facility], [address], [place], [place] [address]      Radiology Studies: Dg Chest Portable 1 View  Result Date: 12/27/2017 CLINICAL DATA:  Respiratory distress. EXAM: PORTABLE CHEST 1 VIEW COMPARISON:  Chest radiograph November 09, 2017 FINDINGS: Cardiomediastinal silhouette is normal. Calcified aortic arch. Increased lung volumes with a focal bullous changes and bibasilar fibrotic scarring. Blunting of bilateral costophrenic angles. No pneumothorax. Soft tissue planes and included osseous structures are non suspicious. IMPRESSION: COPD ( Emphysema (ICD10-J43.9) with bibasilar fibrosis and small pleural effusions versus pleural thickening. Aortic Atherosclerosis (ICD10-I70.0). Electronically Signed   By: Awilda Metro M.D.   On: 12/27/2017 19:34    Pamella Pert, MD, PhD [facility] Pager [phone]  If 7PM-7AM, please contact night-coverage [email or web address] Password [email or web address] 12/29/2017, 2:20 PM

## 2017-12-29 NOTE — Progress Notes (Signed)
Physical Therapy Treatment Patient Details Name: Jeremy Brewer MRN: [id number] DOB: [birth year] Today's Date: 12/29/2017    History of Present Illness Pt is a 84 y.o. male with medical history significant of COPD on 3L O2 at baseline. 7 day history of worsening SOB, progressing to respiratory distress. EMS transported to ED.     PT Comments    Pt states "I'm feeling much better today." and is willing to work with therapy. Pt making good progress towards his goals today, with in only major limitation being oxygen desaturation with ambulation (see General Comments) Pt requires supervision for transfers and min guard for ambulation of 60 feet without AD. PT continues to recommend HHPT for work on energy conservation.     Follow Up Recommendations  Home health PT;Supervision/Assistance - 24 hour     Equipment Recommendations  None recommended by PT    Recommendations for Other Services       Precautions / Restrictions Precautions Precautions: None Restrictions Weight Bearing Restrictions: No    Mobility  Bed Mobility               General bed mobility comments: up in recliner  Transfers Overall transfer level: Needs assistance   Transfers: Sit to/from Stand Sit to Stand: Supervision         General transfer comment: supervision for safety, good power up and steadying  Ambulation/Gait Ambulation/Gait assistance: Min guard Gait Distance (Feet): 60 Feet Assistive device: None Gait Pattern/deviations: Step-through pattern;Shuffle Gait velocity: slowed Gait velocity interpretation: <1.8 ft/sec, indicate of risk for recurrent falls General Gait Details: min guard, slow steady gait, good pursed lipped breathing without cuing       Balance Overall balance assessment: Needs assistance Sitting-balance support: Feet supported;No upper extremity supported Sitting balance-Leahy Scale: Normal     Standing balance support: No upper extremity supported;During functional  activity Standing balance-Leahy Scale: Fair                              Cognition Arousal/Alertness: Awake/alert Behavior During Therapy: WFL for tasks assessed/performed Overall Cognitive Status: Within Functional Limits for tasks assessed                                           General Comments General comments (skin integrity, edema, etc.): Pt on 3L O2 via [place] at rest 98%O2, with ambulation 90%O2, when seated back in recliner dropped to 85%O2 before rebounding to 95%O2 with pursed lipped breathig and hands on knees positioning to maximize accessory muscle usage       Pertinent Vitals/Pain Pain Assessment: No/denies pain           PT Goals (current goals can now be found in the care plan section) Acute Rehab PT Goals Patient Stated Goal: breathe easier PT Goal Formulation: With patient Time For Goal Achievement: 01/11/18 Potential to Achieve Goals: Good    Frequency    Min 3X/week      PT Plan Current plan remains appropriate       AM-PAC PT "6 Clicks" Daily Activity  Outcome Measure  Difficulty turning over in bed (including adjusting bedclothes, sheets and blankets)?: A Little Difficulty moving from lying on back to sitting on the side of the bed? : A Little Difficulty sitting down on and standing up from a chair with arms (e.g., wheelchair, bedside commode, etc,.)?:  A Little Help needed moving to and from a bed to chair (including a wheelchair)?: A Little Help needed walking in hospital room?: A Little Help needed climbing 3-5 steps with a railing? : A Lot 6 Click Score: 17    End of Session Equipment Utilized During Treatment: Gait belt;Oxygen Activity Tolerance: Patient limited by fatigue Patient left: in chair;with call bell/phone within reach;with chair alarm set Nurse Communication: Mobility status PT Visit Diagnosis: Unsteadiness on feet (R26.81);Other abnormalities of gait and mobility (R26.89);Muscle weakness  (generalized) (M62.81);Difficulty in walking, not elsewhere classified (R26.2)     Time: 1610-9604 PT Time Calculation (min) (ACUTE ONLY): 24 min  Charges:  $Gait Training: 23-37 mins                     Jeremy Brewer PT, DPT Acute Rehabilitation Services Pager [phone] Office [phone]    Jeremy Brewer 12/29/2017, 9:13 AM

## 2017-12-29 NOTE — Progress Notes (Signed)
Occupational Therapy Treatment Patient Details Name: Jeremy Brewer MRN: [id number] DOB: [birth year] Today's Date: 12/29/2017    History of present illness Pt is a 84 y.o. male with medical history significant of COPD on 3L O2 at baseline. 7 day history of worsening SOB, progressing to respiratory distress. EMS transported to ED.    OT comments  Completed education regarding energy conservation - handnout provided. Pt ambulated @ 120 ft on 3L with desat to 85. Quickly rebounds to 90 with pursed lip breathing. No OT follow up needed. OT signing off.   Follow Up Recommendations  No OT follow up;Supervision - Intermittent    Equipment Recommendations  None recommended by OT    Recommendations for Other Services      Precautions / Restrictions Precautions Precautions: None Precaution Comments: watch O2 sats       Mobility Bed Mobility               General bed mobility comments: up in recliner  Transfers Overall transfer level: Needs assistance   Transfers: Sit to/from Stand Sit to Stand: Supervision              Balance             Standing balance-Leahy Scale: Fair                             ADL either performed or assessed with clinical judgement   ADL                                         General ADL Comments: Educated pt on energy conservation strategies using handout. also emphasized importance of pursed lip breathing. Educated on activity modification for ADL . Pt staes he was not using his oxygen "like ne needed" before admission.      Vision       Perception     Praxis      Cognition Arousal/Alertness: Awake/alert Behavior During Therapy: WFL for tasks assessed/performed Overall Cognitive Status: Within Functional Limits for tasks assessed                                          Exercises     Shoulder Instructions       General Comments      Pertinent Vitals/ Pain       Pain  Assessment: No/denies pain  Home Living                                          Prior Functioning/Environment              Frequency           Progress Toward Goals  OT Goals(current goals can now be found in the care plan section)  Progress towards OT goals: Goals met/education completed, patient discharged from OT  Acute Rehab OT Goals Patient Stated Goal: breathe easier OT Goal Formulation: With patient Time For Goal Achievement: 01/11/18 ADL Goals Additional ADL Goal #1: Pt will be aware of energy conservation strategies from handout that will be beneficial to him  Plan Discharge plan remains appropriate    Co-evaluation  AM-PAC PT "6 Clicks" Daily Activity     Outcome Measure   Help from another person eating meals?: None Help from another person taking care of personal grooming?: None Help from another person toileting, which includes using toliet, bedpan, or urinal?: None Help from another person bathing (including washing, rinsing, drying)?: None Help from another person to put on and taking off regular upper body clothing?: None Help from another person to put on and taking off regular lower body clothing?: None 6 Click Score: 24    End of Session Equipment Utilized During Treatment: Gait belt;Oxygen(3L)  OT Visit Diagnosis: Other (comment)(poor endurance)   Activity Tolerance Patient tolerated treatment well   Patient Left in chair;with call bell/phone within reach   Nurse Communication Mobility status        Time: 1435-1500 OT Time Calculation (min): 25 min  Charges: OT General Charges $OT Visit: 1 Visit OT Treatments $Self Care/Home Management : 23-37 mins  Maurie Boettcher, OT/L   Acute OT Clinical Specialist [place] Pager [phone] Office [phone]    [facility] 12/29/2017, 4:00 PM

## 2017-12-29 NOTE — Plan of Care (Signed)
Discussed with patient ppan of care for the evening, pain management and importance of lovenox with some teach back displayed

## 2017-12-30 DIAGNOSIS — J9601 Acute respiratory failure with hypoxia: Secondary | ICD-10-CM

## 2017-12-30 LAB — BASIC METABOLIC PANEL
ANION GAP: 8 (ref 5–15)
BUN: 35 mg/dL — ABNORMAL HIGH (ref 8–23)
CHLORIDE: 108 mmol/L (ref 98–111)
CO2: 27 mmol/L (ref 22–32)
Calcium: 8.6 mg/dL — ABNORMAL LOW (ref 8.9–10.3)
Creatinine, Ser: 1.41 mg/dL — ABNORMAL HIGH (ref 0.61–1.24)
GFR calc non Af Amer: 44 mL/min — ABNORMAL LOW (ref >60)
GFR, EST AFRICAN AMERICAN: 51 mL/min — AB (ref >60)
GLUCOSE: 100 mg/dL — AB (ref 70–99)
POTASSIUM: 3.5 mmol/L (ref 3.5–5.1)
Sodium: 143 mmol/L (ref 135–145)

## 2017-12-30 MED ORDER — CEFDINIR 300 MG PO CAPS: 300.0000 mg | ORAL_CAPSULE | Freq: Two times a day (BID) | ORAL | 0 refills | Status: DC

## 2017-12-30 MED ORDER — PREDNISONE 10 MG PO TABS: 40.0000 mg | ORAL_TABLET | Freq: Every day | ORAL | 0 refills | Status: DC

## 2017-12-30 NOTE — Discharge Summary (Signed)
Physician Discharge Summary  Jeremy Brewer WUJ:[id number] DOB: 1930/12/05 DOA: 12/27/2017  PCP: Joycelyn Rua, MD  Admit date: 12/27/2017 Discharge date: 12/30/2017  Admitted From: home Disposition:  Home with home health PT  Recommendations for Outpatient Follow-up:  1. Follow up with PCP in 1-2 weeks 2. Continue Cefdinir and prednisone taper  Home Health: PT Equipment/Devices: home O2  Discharge Condition: stable CODE STATUS: DNR Diet recommendation: regular  HPI: Per Dr. Julian Reil, Jeremy Brewer is a 84 y.o. male with medical history significant of COPD on 3L O2 at baseline. Patient presents to the ED with 7 day history of worsening SOB.  This has progressed to the point of respiratory distress where his son had to call EMS to get him in to the ED.  He has been using the Anoro every day at home.  Hospital Course: Acute on chronic hypoxic and hypercarbic respiratory failure in the setting of COPD exacerbation due to rhinovirus bronchiolitis -patient was admitted to the stepdown because he required BiPAP, he received IV steroids, nebulizers and antibiotics, his respiratory status improved, he no longer needed BiPAP and was transitioned back to his home oxygen level.  He was back to baseline, able to ambulate with physical therapy without significant difficulties, and will be discharged home in stable condition to complete antibiotics with cefdinir and a prednisone taper. Acute kidney injury -in the setting of poor p.o. intake, creatinine improving with fluids, encourage ongoing hydration.  Discharge Diagnoses:  Principal Problem:   COPD exacerbation (HCC) Active Problems:   Acute on chronic respiratory failure with hypoxia [facility])  Discharge Instructions  Allergies as of 12/30/2017   No Known Allergies     Medication List    TAKE these medications   albuterol 108 (90 Base) MCG/ACT inhaler Commonly known as:  PROVENTIL HFA;VENTOLIN HFA Inhale 2 puffs into the lungs every 6  (six) hours as needed for wheezing or shortness of breath.   cefdinir 300 MG capsule Commonly known as:  OMNICEF Take 1 capsule (300 mg total) by mouth 2 (two) times daily.   ipratropium-albuterol 0.5-2.5 (3) MG/3ML Soln Commonly known as:  DUONEB Take 3 mLs by nebulization every 6 (six) hours as needed. What changed:  reasons to take this   OXYGEN Inhale 3 L into the lungs continuous.   predniSONE 10 MG tablet Commonly known as:  DELTASONE Take 4 tablets (40 mg total) by mouth daily. 4 tablets daily x 4 days then 3 daily x 4 days then 2 daily x 4 days then 1 daily x 4 days (40)   umeclidinium-vilanterol 62.5-25 MCG/INH Aepb Commonly known as:  ANORO ELLIPTA Inhale 1 puff into the lungs daily. What changed:  when to take this      Follow-up Information    [facility].   Specialty:  Home Health Services Why:  HHPT Contact information: [address] [place] [address] [phone]        Joycelyn Rua, MD. Schedule an appointment as soon as possible for a visit in 2 week(s).   Specialty:  Family Medicine Contact information: [address] [place] [address] [phone]           Consultations:  None   Procedures/Studies:  Dg Chest Portable 1 View  Result Date: 12/27/2017 CLINICAL DATA:  Respiratory distress. EXAM: PORTABLE CHEST 1 VIEW COMPARISON:  Chest radiograph November 09, 2017 FINDINGS: Cardiomediastinal silhouette is normal. Calcified aortic arch. Increased lung volumes with a focal bullous changes and  bibasilar fibrotic scarring. Blunting of bilateral costophrenic angles. No pneumothorax. Soft tissue planes and included osseous structures are non suspicious. IMPRESSION: COPD ( Emphysema (ICD10-J43.9) with bibasilar fibrosis and small pleural effusions versus pleural thickening. Aortic Atherosclerosis (ICD10-I70.0). Electronically Signed   By: Awilda Metro M.D.   On: 12/27/2017 19:34      Subjective: - no chest pain, shortness of breath, no abdominal pain, nausea or vomiting.   Discharge Exam: Vitals:   12/30/17 0751 12/30/17 0752  BP:    Pulse:    Resp:    Temp:    SpO2: 91% 91%    General: Pt is alert, awake, not in acute distress Cardiovascular: RRR, S1/S2 +, no rubs, no gallops Respiratory: CTA bilaterally, no wheezing, no rhonchi Abdominal: Soft, NT, ND, bowel sounds + Extremities: no edema, no cyanosis   The results of significant diagnostics from this hospitalization (including imaging, microbiology, ancillary and laboratory) are listed below for reference.     Microbiology: Recent Results (from the past 240 hour(s))  Culture, blood (routine x 2)     Status: None (Preliminary result)   Collection Time: 12/27/17  7:00 PM  Result Value Ref Range Status   Specimen Description BLOOD LEFT HAND  Final   Special Requests   Final    BOTTLES DRAWN AEROBIC ONLY Blood Culture adequate volume   Culture   Final    NO GROWTH 2 DAYS Performed at [facility], [address], [place], [place] [address]    Report Status PENDING  Incomplete  Culture, blood (routine x 2)     Status: None (Preliminary result)   Collection Time: 12/27/17  7:05 PM  Result Value Ref Range Status   Specimen Description BLOOD BLOOD RIGHT FOREARM  Final   Special Requests   Final    BOTTLES DRAWN AEROBIC AND ANAEROBIC Blood Culture adequate volume   Culture   Final    NO GROWTH 2 DAYS Performed at [facility], [address] [place], [place] [address]    Report Status PENDING  Incomplete  MRSA PCR Screening     Status: None   Collection Time: 12/28/17 12:27 AM  Result Value Ref Range Status   MRSA by PCR NEGATIVE NEGATIVE Final    Comment:        The GeneXpert MRSA Assay (FDA approved for NASAL specimens only), is one component of a comprehensive MRSA colonization surveillance program. It is not intended to diagnose MRSA infection nor to guide or monitor  treatment for MRSA infections. Performed at [facility], [address] [place], [place] [address]   Respiratory Panel by PCR     Status: Abnormal   Collection Time: 12/28/17  6:33 AM  Result Value Ref Range Status   Adenovirus NOT DETECTED NOT DETECTED Final   Coronavirus 229E NOT DETECTED NOT DETECTED Final   Coronavirus HKU1 NOT DETECTED NOT DETECTED Final   Coronavirus NL63 NOT DETECTED NOT DETECTED Final   Coronavirus OC43 NOT DETECTED NOT DETECTED Final   Metapneumovirus NOT DETECTED NOT DETECTED Final   Rhinovirus / Enterovirus DETECTED (A) NOT DETECTED Final   Influenza A NOT DETECTED NOT DETECTED Final   Influenza B NOT DETECTED NOT DETECTED Final   Parainfluenza Virus 1 NOT DETECTED NOT DETECTED Final   Parainfluenza Virus 2 NOT DETECTED NOT DETECTED Final   Parainfluenza Virus 3 NOT DETECTED NOT DETECTED Final   Parainfluenza Virus 4 NOT DETECTED NOT DETECTED Final   Respiratory Syncytial Virus NOT DETECTED  NOT DETECTED Final   Bordetella pertussis NOT DETECTED NOT DETECTED Final   Chlamydophila pneumoniae NOT DETECTED NOT DETECTED Final   Mycoplasma pneumoniae NOT DETECTED NOT DETECTED Final    Comment: Performed at [facility], [address], [place], [place] [address]     Labs: BNP (last 3 results) Recent Labs    12/27/17 1856  BNP 178.8*   Basic Metabolic Panel: Recent Labs  Lab 12/27/17 1856 12/28/17 0008 12/29/17 0254 12/30/17 0312  NA 136 135 140 143  K 4.3 3.6 3.8 3.5  CL 101 101 106 108  CO2 23 24 27 27   GLUCOSE 134* 187* 123* 100*  BUN 26* 30* 40* 35*  CREATININE 1.54* 1.67* 1.51* 1.41*  CALCIUM 9.0 8.5* 8.6* 8.6*   Liver Function Tests: Recent Labs  Lab 12/27/17 1856  AST 24  ALT 14  ALKPHOS 51  BILITOT 1.4*  PROT 7.2  ALBUMIN 3.5   No results for input(s): LIPASE, AMYLASE in the last 168 hours. No results for input(s): AMMONIA in the last 168 hours. CBC: Recent Labs  Lab 12/27/17 1856 12/28/17 0008  WBC 15.3*  9.0  NEUTROABS 9.0*  --   HGB 15.7 13.8  HCT 50.2 42.1  MCV 91.1 90.0  PLT 331 251   Cardiac Enzymes: Recent Labs  Lab 12/27/17 1856  TROPONINI 0.06*   BNP: Invalid input(s): POCBNP CBG: No results for input(s): GLUCAP in the last 168 hours. D-Dimer No results for input(s): DDIMER in the last 72 hours. Hgb A1c No results for input(s): HGBA1C in the last 72 hours. Lipid Profile No results for input(s): CHOL, HDL, LDLCALC, TRIG, CHOLHDL, LDLDIRECT in the last 72 hours. Thyroid function studies No results for input(s): TSH, T4TOTAL, T3FREE, THYROIDAB in the last 72 hours.  Invalid input(s): FREET3 Anemia work up No results for input(s): VITAMINB12, FOLATE, FERRITIN, TIBC, IRON, RETICCTPCT in the last 72 hours. Urinalysis    Component Value Date/Time   COLORURINE YELLOW 12/28/2017 2329   APPEARANCEUR HAZY (A) 12/28/2017 2329   LABSPEC 1.020 12/28/2017 2329   PHURINE 6.0 12/28/2017 2329   GLUCOSEU NEGATIVE 12/28/2017 2329   HGBUR MODERATE (A) 12/28/2017 2329   BILIRUBINUR NEGATIVE 12/28/2017 2329   KETONESUR NEGATIVE 12/28/2017 2329   PROTEINUR 30 (A) 12/28/2017 2329   UROBILINOGEN 1.0 01/27/2015 0336   NITRITE NEGATIVE 12/28/2017 2329   LEUKOCYTESUR NEGATIVE 12/28/2017 2329   Sepsis Labs Invalid input(s): PROCALCITONIN,  WBC,  LACTICIDVEN   Time coordinating discharge: 40 minutes  SIGNED:  Pamella Pert, MD  [facility] 12/30/2017, 1:32 PM Pager [phone]  If 7PM-7AM, please contact night-coverage [email or web address] Password [email or web address]

## 2017-12-30 NOTE — Progress Notes (Signed)
Discharge instructions given. Pt verbalized understanding and all questions were answered.  

## 2017-12-30 NOTE — Discharge Instructions (Signed)
Follow with Jeremy Rua, MD in 1-2 weeks  Please get a complete blood count and chemistry panel checked by your Primary MD at your next visit, and again as instructed by your Primary MD. Please get your medications reviewed and adjusted by your Primary MD.  Please request your Primary MD to go over all Hospital Tests and Procedure/Radiological results at the follow up, please get all Hospital records sent to your Prim MD by signing hospital release before you go home.  If you had Pneumonia of Lung problems at the Hospital: Please get a 2 view Chest X ray done in 6-8 weeks after hospital discharge or sooner if instructed by your Primary MD.  If you have Congestive Heart Failure: Please call your Cardiologist or Primary MD anytime you have any of the following symptoms:  1) 3 pound weight gain in 24 hours or 5 pounds in 1 week  2) shortness of breath, with or without a dry hacking cough  3) swelling in the hands, feet or stomach  4) if you have to sleep on extra pillows at night in order to breathe  Follow cardiac low salt diet and 1.5 lit/day fluid restriction.  If you have diabetes Accuchecks 4 times/day, Once in AM empty stomach and then before each meal. Log in all results and show them to your primary doctor at your next visit. If any glucose reading is under 80 or above 300 call your primary MD immediately.  If you have Seizure/Convulsions/Epilepsy: Please do not drive, operate heavy machinery, participate in activities at heights or participate in high speed sports until you have seen by Primary MD or a Neurologist and advised to do so again.  If you had Gastrointestinal Bleeding: Please ask your Primary MD to check a complete blood count within one week of discharge or at your next visit. Your endoscopic/colonoscopic biopsies that are pending at the time of discharge, will also need to followed by your Primary MD.  Get Medicines reviewed and adjusted. Please take all your  medications with you for your next visit with your Primary MD  Please request your Primary MD to go over all hospital tests and procedure/radiological results at the follow up, please ask your Primary MD to get all Hospital records sent to his/her office.  If you experience worsening of your admission symptoms, develop shortness of breath, life threatening emergency, suicidal or homicidal thoughts you must seek medical attention immediately by calling 911 or calling your MD immediately  if symptoms less severe.  You must read complete instructions/literature along with all the possible adverse reactions/side effects for all the Medicines you take and that have been prescribed to you. Take any new Medicines after you have completely understood and accpet all the possible adverse reactions/side effects.   Do not drive or operate heavy machinery when taking Pain medications.   Do not take more than prescribed Pain, Sleep and Anxiety Medications  Special Instructions: If you have smoked or chewed Tobacco  in the last 2 yrs please stop smoking, stop any regular Alcohol  and or any Recreational drug use.  Wear Seat belts while driving.  Please note You were cared for by a hospitalist during your hospital stay. If you have any questions about your discharge medications or the care you received while you were in the hospital after you are discharged, you can call the unit and asked to speak with the hospitalist on call if the hospitalist that took care of you is not available. Once  you are discharged, your primary care physician will handle any further medical issues. Please note that NO REFILLS for any discharge medications will be authorized once you are discharged, as it is imperative that you return to your primary care physician (or establish a relationship with a primary care physician if you do not have one) for your aftercare needs so that they can reassess your need for medications and monitor your  lab values.  You can reach the hospitalist office at phone [phone] or fax [phone]   If you do not have a primary care physician, you can call [phone] for a physician referral.  Activity: As tolerated with Full fall precautions use walker/cane & assistance as needed  Diet: regular  Disposition Home

## 2017-12-31 ENCOUNTER — Other Ambulatory Visit: Payer: Self-pay | Admitting: Pulmonary Disease

## 2017-12-31 DIAGNOSIS — J441 Chronic obstructive pulmonary disease with (acute) exacerbation: Secondary | ICD-10-CM | POA: Diagnosis not present

## 2017-12-31 DIAGNOSIS — J219 Acute bronchiolitis, unspecified: Secondary | ICD-10-CM | POA: Diagnosis not present

## 2017-12-31 DIAGNOSIS — Z792 Long term (current) use of antibiotics: Secondary | ICD-10-CM | POA: Diagnosis not present

## 2017-12-31 DIAGNOSIS — B348 Other viral infections of unspecified site: Secondary | ICD-10-CM | POA: Diagnosis not present

## 2017-12-31 DIAGNOSIS — Z9981 Dependence on supplemental oxygen: Secondary | ICD-10-CM | POA: Diagnosis not present

## 2017-12-31 DIAGNOSIS — Z7952 Long term (current) use of systemic steroids: Secondary | ICD-10-CM | POA: Diagnosis not present

## 2017-12-31 DIAGNOSIS — J9622 Acute and chronic respiratory failure with hypercapnia: Secondary | ICD-10-CM | POA: Diagnosis not present

## 2017-12-31 DIAGNOSIS — J9621 Acute and chronic respiratory failure with hypoxia: Secondary | ICD-10-CM | POA: Diagnosis not present

## 2017-12-31 NOTE — Addendum Note (Signed)
Addended by: Tana Felts on: 12/31/2017 03:47 PM   Modules accepted: Orders

## 2018-01-01 LAB — CULTURE, BLOOD (ROUTINE X 2)
CULTURE: NO GROWTH
Culture: NO GROWTH
Special Requests: ADEQUATE
Special Requests: ADEQUATE

## 2018-01-04 DIAGNOSIS — J449 Chronic obstructive pulmonary disease, unspecified: Secondary | ICD-10-CM | POA: Diagnosis not present

## 2018-01-04 DIAGNOSIS — J9611 Chronic respiratory failure with hypoxia: Secondary | ICD-10-CM | POA: Diagnosis not present

## 2018-01-08 DIAGNOSIS — J449 Chronic obstructive pulmonary disease, unspecified: Secondary | ICD-10-CM | POA: Diagnosis not present

## 2018-01-21 DIAGNOSIS — J189 Pneumonia, unspecified organism: Secondary | ICD-10-CM | POA: Diagnosis not present

## 2018-01-21 DIAGNOSIS — J449 Chronic obstructive pulmonary disease, unspecified: Secondary | ICD-10-CM | POA: Diagnosis not present

## 2018-01-21 DIAGNOSIS — J441 Chronic obstructive pulmonary disease with (acute) exacerbation: Secondary | ICD-10-CM | POA: Diagnosis not present

## 2018-02-08 DIAGNOSIS — J449 Chronic obstructive pulmonary disease, unspecified: Secondary | ICD-10-CM | POA: Diagnosis not present

## 2018-02-15 DIAGNOSIS — H52209 Unspecified astigmatism, unspecified eye: Secondary | ICD-10-CM | POA: Diagnosis not present

## 2018-02-15 DIAGNOSIS — H5203 Hypermetropia, bilateral: Secondary | ICD-10-CM | POA: Diagnosis not present

## 2018-02-15 DIAGNOSIS — H524 Presbyopia: Secondary | ICD-10-CM | POA: Diagnosis not present

## 2018-02-21 DIAGNOSIS — J189 Pneumonia, unspecified organism: Secondary | ICD-10-CM | POA: Diagnosis not present

## 2018-02-21 DIAGNOSIS — J449 Chronic obstructive pulmonary disease, unspecified: Secondary | ICD-10-CM | POA: Diagnosis not present

## 2018-02-21 DIAGNOSIS — J441 Chronic obstructive pulmonary disease with (acute) exacerbation: Secondary | ICD-10-CM | POA: Diagnosis not present

## 2018-03-10 DIAGNOSIS — J449 Chronic obstructive pulmonary disease, unspecified: Secondary | ICD-10-CM | POA: Diagnosis not present

## 2018-03-23 DIAGNOSIS — J449 Chronic obstructive pulmonary disease, unspecified: Secondary | ICD-10-CM | POA: Diagnosis not present

## 2018-03-23 DIAGNOSIS — J189 Pneumonia, unspecified organism: Secondary | ICD-10-CM | POA: Diagnosis not present

## 2018-03-23 DIAGNOSIS — J441 Chronic obstructive pulmonary disease with (acute) exacerbation: Secondary | ICD-10-CM | POA: Diagnosis not present

## 2018-04-10 DIAGNOSIS — J449 Chronic obstructive pulmonary disease, unspecified: Secondary | ICD-10-CM | POA: Diagnosis not present

## 2018-04-23 DIAGNOSIS — H43393 Other vitreous opacities, bilateral: Secondary | ICD-10-CM | POA: Diagnosis not present

## 2018-04-23 DIAGNOSIS — H2513 Age-related nuclear cataract, bilateral: Secondary | ICD-10-CM | POA: Diagnosis not present

## 2018-04-23 DIAGNOSIS — H02834 Dermatochalasis of left upper eyelid: Secondary | ICD-10-CM | POA: Diagnosis not present

## 2018-04-23 DIAGNOSIS — H268 Other specified cataract: Secondary | ICD-10-CM | POA: Diagnosis not present

## 2018-04-23 DIAGNOSIS — H52203 Unspecified astigmatism, bilateral: Secondary | ICD-10-CM | POA: Diagnosis not present

## 2018-04-23 DIAGNOSIS — H524 Presbyopia: Secondary | ICD-10-CM | POA: Diagnosis not present

## 2018-04-23 DIAGNOSIS — H5203 Hypermetropia, bilateral: Secondary | ICD-10-CM | POA: Diagnosis not present

## 2018-04-23 DIAGNOSIS — H02831 Dermatochalasis of right upper eyelid: Secondary | ICD-10-CM | POA: Diagnosis not present

## 2018-05-11 DIAGNOSIS — J449 Chronic obstructive pulmonary disease, unspecified: Secondary | ICD-10-CM | POA: Diagnosis not present

## 2018-05-16 ENCOUNTER — Other Ambulatory Visit: Payer: Self-pay | Admitting: Adult Health

## 2018-05-20 DIAGNOSIS — R05 Cough: Secondary | ICD-10-CM | POA: Diagnosis not present

## 2018-05-22 ENCOUNTER — Ambulatory Visit (HOSPITAL_BASED_OUTPATIENT_CLINIC_OR_DEPARTMENT_OTHER)
Admission: RE | Admit: 2018-05-22 | Discharge: 2018-05-22 | Disposition: A | Discharge status: Home / Self Care | Payer: Medicare HMO | Source: Ambulatory Visit | Attending: Pulmonary Disease | Admitting: Pulmonary Disease

## 2018-05-22 DIAGNOSIS — R911 Solitary pulmonary nodule: Secondary | ICD-10-CM | POA: Diagnosis not present

## 2018-05-22 DIAGNOSIS — J449 Chronic obstructive pulmonary disease, unspecified: Secondary | ICD-10-CM | POA: Diagnosis not present

## 2018-05-27 ENCOUNTER — Telehealth: Payer: Self-pay | Admitting: Pulmonary Disease

## 2018-05-27 NOTE — Telephone Encounter (Signed)
LMTCB    Notes recorded by Lupita Leash, MD on 05/23/2018 at 1:53 PM EST K, Please let the patient know this showed the nodules were OK, there was some emphysema Thanks, B

## 2018-05-29 NOTE — Telephone Encounter (Signed)
LMTCB

## 2018-05-29 NOTE — Telephone Encounter (Signed)
Pt's wife is calling back [phone]

## 2018-05-29 NOTE — Telephone Encounter (Signed)
Spoke with pt's spouse and notified of results per BQ  She verbalized understanding and will inform the pt

## 2018-06-03 ENCOUNTER — Ambulatory Visit: Payer: Medicare HMO | Admitting: Pulmonary Disease

## 2018-06-03 ENCOUNTER — Encounter: Payer: Self-pay | Admitting: Pulmonary Disease

## 2018-06-03 VITALS — BP 101/70 | HR 80 | Ht 67.0 in

## 2018-06-03 DIAGNOSIS — R911 Solitary pulmonary nodule: Secondary | ICD-10-CM

## 2018-06-03 DIAGNOSIS — J438 Other emphysema: Secondary | ICD-10-CM

## 2018-06-03 DIAGNOSIS — J9611 Chronic respiratory failure with hypoxia: Secondary | ICD-10-CM | POA: Diagnosis not present

## 2018-06-03 DIAGNOSIS — J441 Chronic obstructive pulmonary disease with (acute) exacerbation: Secondary | ICD-10-CM | POA: Diagnosis not present

## 2018-06-03 MED ORDER — UMECLIDINIUM-VILANTEROL 62.5-25 MCG/INH IN AEPB: 1.0000 | INHALATION_SPRAY | Freq: Every day | RESPIRATORY_TRACT | 3 refills | Status: DC

## 2018-06-03 MED ORDER — AZITHROMYCIN 250 MG PO TABS: 250.0000 mg | ORAL_TABLET | Freq: Every day | ORAL | 1 refills | Status: DC

## 2018-06-03 NOTE — Progress Notes (Signed)
Subjective:   PATIENT ID: Marcelle Smiling GENDER: male DOB: 03/06/1931, MRN: [id number]  Synopsis: Referred in January 2019 for COPD He also has pulmonary nodules.  HPI  Chief Complaint  Patient presents with  . Follow-up    6 month follow up for COPD.    He was hospitalized in October of this year with a rhinovirus infection causing a severe exacerbation of his COPD.  He says that since then he got sick again in about 3 weeks ago he had to go back to his primary care doctor for increasing shortness of breath, cough, short mucus production.  Since then he says he is back to "normal".  He is washing his dishes, doing work around the house, using oxygen continuously.  He continues to take Anoro which she says helps.   Past Medical History:  Diagnosis Date  . COPD (chronic obstructive pulmonary disease) (HCC)       Review of Systems  Constitutional: Negative for chills, fever, malaise/fatigue and weight loss.  HENT: Negative for congestion, ear pain, nosebleeds, sinus pain and sore throat.   Eyes: Negative for photophobia, pain, discharge and redness.  Respiratory: Positive for cough, shortness of breath and wheezing. Negative for hemoptysis and sputum production.   Cardiovascular: Negative for chest pain, palpitations, orthopnea, leg swelling and PND.  Gastrointestinal: Negative for abdominal pain, constipation, diarrhea, nausea and vomiting.  Genitourinary: Negative for dysuria, frequency, hematuria and urgency.  Musculoskeletal: Negative for back pain, joint pain, myalgias and neck pain.  Skin: Negative for itching and rash.  Neurological: Negative for tingling, tremors, sensory change, speech change, focal weakness, seizures, weakness and headaches.  Endo/Heme/Allergies: Does not bruise/bleed easily.  Psychiatric/Behavioral: Negative for depression, memory loss, substance abuse and suicidal ideas. The patient is not nervous/anxious.       Objective:  Physical  Exam   Vitals:   06/03/18 1541  BP: 101/70  Pulse: 80  SpO2: 94%  Height: 5\' 7"  (1.702 m)   3L [address]  Gen: chronically ill appearing HENT: OP clear, TM's clear, neck supple PULM: Poor air movement B, normal percussion CV: RRR, no mgr, trace edema GI: BS+, soft, nontender Derm: no cyanosis or rash Psyche: normal mood and affect      CBC    Component Value Date/Time   WBC 9.0 12/28/2017 0008   RBC 4.68 12/28/2017 0008   HGB 13.8 12/28/2017 0008   HCT 42.1 12/28/2017 0008   PLT 251 12/28/2017 0008   MCV 90.0 12/28/2017 0008   MCH 29.5 12/28/2017 0008   MCHC 32.8 12/28/2017 0008   RDW 12.7 12/28/2017 0008   LYMPHSABS 3.8 12/27/2017 1856   MONOABS 2.3 (H) 12/27/2017 1856   EOSABS 0.0 12/27/2017 1856   BASOSABS 0.2 (H) 12/27/2017 1856     Chest imaging: 02/2017 CXR: emphysema, whipsy upper lobe infiltrates February 2019 CT chest images independently reviewed showing emphysema bilaterally, scattered pulmonary nodules throughout favored to represent an inflammatory etiology, largest approximately 2 cm in the right lower lobe 10/2017 CT cest> stable pulmonary nodules, largest 8mm, emphysema noted February 2020 CT chest images independently reviewed showing small pulmonary nodules unchanged from February 2019 and considered benign, emphysema.  PFT: 03/2017 Cleda Daub: ratio 31%, FEV1 0.47L (19% pred)  Labs:  Path:  Echo:  Heart Catheterization:   Records from his October 2019 COPD exacerbation reviewed where he had rhinovirus bronchiolitis, required BiPAP, IV steroids and bronchodilators.    Assessment & Plan:   Solitary pulmonary nodule  Other emphysema (HCC)  Chronic respiratory failure with hypoxia (HCC)  COPD exacerbation (HCC)  Discussion: I am concerned about Mr. Sill.  He has very severe airflow obstruction and was hospitalized for the second time in 2 years a few months back.  In addition to that he has had another exacerbation at home requiring him to  go back to his primary care doctor for outpatient treatment.  I explained to him that this is a poor prognostic sign considering the severity of his disease and the recurrence of these exacerbations.  I think the best approach moving forward is to put him on something that will break the cycle of these frequent exacerbations, specifically something like daily azithromycin.  Plan: COPD with recurrent exacerbations: Continue Anoro 1 puff daily Start taking azithromycin 250 mg daily no matter how you feel, we will plan on treating you for 6 months with this medicine Come back in 6 weeks for an EKG and blood work (liver function test) to make sure there is no problems from you taking this medicine Keep using DuoNeb as needed for shortness of breath Practice good hand hygiene Stay physically active  Pulmonary nodules: Stable on most recent imaging, no further imaging needed  Chronic respiratory failure with hypoxemia: Keep taking oxygen as you are doing  We will see you back in 6 weeks with a nurse practitioner    Current Outpatient Medications:  .  ipratropium-albuterol (DUONEB) 0.5-2.5 (3) MG/3ML SOLN, Take 3 mLs by nebulization every 6 (six) hours as needed. (Patient taking differently: Take 3 mLs by nebulization every 6 (six) hours as needed (shortness of breath/wheezing). ), Disp: 360 mL, Rfl: 2 .  OXYGEN, Inhale 3 L into the lungs continuous. , Disp: , Rfl:  .  umeclidinium-vilanterol (ANORO ELLIPTA) 62.5-25 MCG/INH AEPB, Inhale 1 puff into the lungs daily. (Patient taking differently: Inhale 1 puff into the lungs daily at 12 noon. ), Disp: 1 each, Rfl: 3 .  VENTOLIN HFA 108 (90 Base) MCG/ACT inhaler, INHALE 2 PUFFS INTO THE LUNGS EVERY 6 HOURS AS NEEDED FOR WHEEZING OR SHORTNESS OF BREATH, Disp: 18 g, Rfl: 5

## 2018-06-03 NOTE — Patient Instructions (Signed)
COPD with recurrent exacerbations: Continue Anoro 1 puff daily Start taking azithromycin 250 mg daily no matter how you feel, we will plan on treating you for 6 months with this medicine Come back in 6 weeks for an EKG and blood work (liver function test) to make sure there is no problems from you taking this medicine Keep using DuoNeb as needed for shortness of breath Practice good hand hygiene Stay physically active  Chronic respiratory failure with hypoxemia: Keep taking oxygen as you are doing  We will see you back in 6 weeks with a nurse practitioner

## 2018-06-09 DIAGNOSIS — J449 Chronic obstructive pulmonary disease, unspecified: Secondary | ICD-10-CM | POA: Diagnosis not present

## 2018-06-10 ENCOUNTER — Telehealth: Payer: Self-pay | Admitting: Pulmonary Disease

## 2018-06-10 MED ORDER — PREDNISONE 10 MG PO TABS: ORAL_TABLET | ORAL | 0 refills | Status: DC

## 2018-06-10 NOTE — Telephone Encounter (Signed)
Spoke with patient's son, he is aware of TPs recs.   Will go ahead and call in prednisone for him.   Nothing further needed at time of call.

## 2018-06-10 NOTE — Telephone Encounter (Signed)
Primary Pulmonologist: BQ Last office visit and with whom: 06/03/18 w/BQ What do we see them for (pulmonary problems): COPD  Last OV assessment/plan:  Return in about 6 weeks (around 07/15/2018).  COPD with recurrent exacerbations: Continue Anoro 1 puff daily Start taking azithromycin 250 mg daily no matter how you feel, we will plan on treating you for 6 months with this medicine Come back in 6 weeks for an EKG and blood work (liver function test) to make sure there is no problems from you taking this medicine Keep using DuoNeb as needed for shortness of breath Practice good hand hygiene Stay physically active  Chronic respiratory failure with hypoxemia: Keep taking oxygen as you are doing  We will see you back in 6 weeks with a nurse practitioner       Was appointment offered to patient (explain)?  Not offering acute visits to patients.    Reason for call: Patient was prescribed azithromycin 250mg  to be taken daily on 06/03/18. Per patient's son and wife, he broke out into hives over the weekend. This is the only new medication that he has started in the past few days. He has been taking Benadryl with no relief. Denied any SOB or feeling like his throat was closing up. For now, they have stopped giving medication to patient.   They want to know if there is another medication that can be prescribed for him.   TP, please advise since BQ is not in the office today.

## 2018-06-10 NOTE — Telephone Encounter (Signed)
Pt's son is returning call. Cb is [phone].

## 2018-06-10 NOTE — Telephone Encounter (Signed)
Left message for patients son to call back.

## 2018-06-10 NOTE — Telephone Encounter (Signed)
It is possible it is the Zithromax  Good that they stopped.  Make sure no increased breathing issues   Would take benadryl 25mg  As needed   Begin Pepcid 20mg  At bedtime  X 5 days  Cool compresses, avoid hot showers  Prednisone 20mg  daily for 3 days , take with food #3 , no fills.   If not improving will need ov for further evaluation . Rash should subside in next 2-3 days , if not ov , if worse ov sooner or ER .  Make sure has ov set up for follow up   Please contact office for sooner follow up if symptoms do not improve or worsen or seek emergency care

## 2018-06-13 DIAGNOSIS — T7840XD Allergy, unspecified, subsequent encounter: Secondary | ICD-10-CM | POA: Diagnosis not present

## 2018-07-01 DIAGNOSIS — T7840XD Allergy, unspecified, subsequent encounter: Secondary | ICD-10-CM | POA: Diagnosis not present

## 2018-07-10 DIAGNOSIS — J449 Chronic obstructive pulmonary disease, unspecified: Secondary | ICD-10-CM | POA: Diagnosis not present

## 2018-07-15 DIAGNOSIS — L308 Other specified dermatitis: Secondary | ICD-10-CM | POA: Diagnosis not present

## 2018-07-15 DIAGNOSIS — L258 Unspecified contact dermatitis due to other agents: Secondary | ICD-10-CM | POA: Diagnosis not present

## 2018-08-01 ENCOUNTER — Emergency Department (HOSPITAL_BASED_OUTPATIENT_CLINIC_OR_DEPARTMENT_OTHER)
Admission: EM | Admit: 2018-08-01 | Discharge: 2018-08-01 | Disposition: A | Discharge status: Home / Self Care | Payer: Medicare HMO | Attending: Emergency Medicine | Admitting: Emergency Medicine

## 2018-08-01 ENCOUNTER — Encounter (HOSPITAL_BASED_OUTPATIENT_CLINIC_OR_DEPARTMENT_OTHER): Payer: Self-pay

## 2018-08-01 ENCOUNTER — Other Ambulatory Visit: Payer: Self-pay

## 2018-08-01 DIAGNOSIS — Z79899 Other long term (current) drug therapy: Secondary | ICD-10-CM | POA: Diagnosis not present

## 2018-08-01 DIAGNOSIS — J449 Chronic obstructive pulmonary disease, unspecified: Secondary | ICD-10-CM | POA: Insufficient documentation

## 2018-08-01 DIAGNOSIS — Z87891 Personal history of nicotine dependence: Secondary | ICD-10-CM | POA: Diagnosis not present

## 2018-08-01 DIAGNOSIS — R21 Rash and other nonspecific skin eruption: Secondary | ICD-10-CM

## 2018-08-01 MED ORDER — PREDNISONE 20 MG PO TABS: ORAL_TABLET | ORAL | 0 refills | Status: DC

## 2018-08-01 NOTE — ED Provider Notes (Signed)
[facility] EMERGENCY DEPARTMENT Provider Note   CSN: [id number] Arrival date & time: 08/01/18  1820    History   Chief Complaint Chief Complaint  Jeremy Brewer presents with  . Rash    HPI Jeremy Brewer is a 85 y.o. male.     85 yo M with a chief complaints of rash.  Jeremy Brewer has had a rash for the past couple months.  Mostly on the extremities describes it is itchy and burning.  Has seen his family doctor and then a dermatologist for this.  States that he has multiple creams and recently finished a prolonged course of prednisone.  States that the prednisone helped but now that he stopped it is coming back and getting worse.  He mowed the lawn couple days ago and felt that that made the rash on his feet much worse.  He denies fevers or chills denies intraoral involvement denies difficulty breathing.  The history is provided by the Jeremy Brewer.  Rash  Associated symptoms: no abdominal pain, no diarrhea, no fever, no headaches, no joint pain, no myalgias, no shortness of breath and not vomiting   Illness  Severity:  Moderate Onset quality:  Gradual Duration:  2 months Timing:  Constant Progression:  Worsening Chronicity:  New Associated symptoms: rash   Associated symptoms: no abdominal pain, no chest pain, no congestion, no diarrhea, no fever, no headaches, no myalgias, no shortness of breath and no vomiting     Past Medical History:  Diagnosis Date  . COPD (chronic obstructive pulmonary disease) [facility])     Jeremy Brewer Active Problem List   Diagnosis Date Noted  . COPD exacerbation (HCC) 12/27/2017  . Chronic respiratory failure with hypoxia (HCC) 04/26/2017  . Lung nodules 04/26/2017  . Acute on chronic respiratory failure (HCC) 03/23/2017  . Acute on chronic respiratory failure with hypoxia (HCC) 03/22/2017  . Sepsis (HCC) 01/27/2015  . Community acquired pneumonia 01/27/2015  . COPD (chronic obstructive pulmonary disease) (HCC) 01/27/2015  . Polycythemia 01/27/2015  .  Tobacco abuse 01/27/2015    Past Surgical History:  Procedure Laterality Date  . APPENDECTOMY          Home Medications    Prior to Admission medications   Medication Sig Start Date End Date Taking? Authorizing Provider  ALCLOMETASONE DIPROPIONATE EX Apply topically.   Yes [provider]  mupirocin cream (BACTROBAN) 2 % Apply 1 application topically 2 (two) times daily.   Yes [provider]  OXYGEN Inhale 3 L into the lungs continuous.    Yes [provider]  triamcinolone cream (KENALOG) 0.5 % Apply 1 application topically 3 (three) times daily.   Yes [provider]  umeclidinium-vilanterol (ANORO ELLIPTA) 62.5-25 MCG/INH AEPB Inhale 1 puff into the lungs daily. 06/03/18  Yes McQuaid, Brooke Pace, MD  VENTOLIN HFA 108 (90 Base) MCG/ACT inhaler INHALE 2 PUFFS INTO THE LUNGS EVERY 6 HOURS AS NEEDED FOR WHEEZING OR SHORTNESS OF BREATH 05/16/18  Yes Lupita Leash, MD  azithromycin (ZITHROMAX) 250 MG tablet Take 1 tablet (250 mg total) by mouth daily. 06/03/18   Lupita Leash, MD  ipratropium-albuterol (DUONEB) 0.5-2.5 (3) MG/3ML SOLN Take 3 mLs by nebulization every 6 (six) hours as needed. Jeremy Brewer taking differently: Take 3 mLs by nebulization every 6 (six) hours as needed (shortness of breath/wheezing).  03/23/17   Meredeth Ide, MD  predniSONE (DELTASONE) 20 MG tablet 2 tabs po daily x 4 days 08/01/18   Melene Plan, DO    Family History Family  History  Problem Relation Age of Onset  . Diabetes Mellitus II Neg Hx   . CAD Neg Hx     Social History Social History   Tobacco Use  . Smoking status: Former Smoker    Packs/day: 2.00    Types: Cigarettes    Last attempt to quit: 2016    Years since quitting: 4.3  . Smokeless tobacco: Never Used  Substance Use Topics  . Alcohol use: No  . Drug use: No     Allergies   Azithromycin   Review of Systems Review of Systems  Constitutional: Negative for chills and fever.  HENT: Negative  for congestion and facial swelling.   Eyes: Negative for discharge and visual disturbance.  Respiratory: Negative for shortness of breath.   Cardiovascular: Negative for chest pain and palpitations.  Gastrointestinal: Negative for abdominal pain, diarrhea and vomiting.  Musculoskeletal: Negative for arthralgias and myalgias.  Skin: Positive for rash. Negative for color change.  Neurological: Negative for tremors, syncope and headaches.  Psychiatric/Behavioral: Negative for confusion and dysphoric mood.     Physical Exam Updated Vital Signs BP (!) 169/100 (BP Location: Right Arm)   Pulse 91   Temp 98.3 F (36.8 C)   Resp (!) 22   Ht  (1.702 m)   Wt 68.5 kg   SpO2 100%   BMI 23.65 kg/m   Physical Exam Vitals signs and nursing note reviewed.  Constitutional:      Appearance: He is well-developed.  HENT:     Head: Normocephalic and atraumatic.  Eyes:     Pupils: Pupils are equal, round, and reactive to light.  Neck:     Musculoskeletal: Normal range of motion and neck supple.     Vascular: No JVD.  Cardiovascular:     Rate and Rhythm: Normal rate and regular rhythm.     Heart sounds: No murmur. No friction rub. No gallop.   Pulmonary:     Effort: No respiratory distress.     Breath sounds: No wheezing.  Abdominal:     General: There is no distension.     Tenderness: There is no guarding or rebound.  Musculoskeletal: Normal range of motion.  Skin:    Coloration: Skin is not pale.     Findings: Rash present.     Comments: Diffuse vesicular appearing rash some bullae to the lower extremities.  Areas of scabbing onto the upper extremities.  Some linear distribution.  Neurological:     Mental Status: He is alert and oriented to person, place, and time.  Psychiatric:        Behavior: Behavior normal.      ED Treatments / Results  Labs (all labs ordered are listed, but only abnormal results are displayed) Labs Reviewed - No data to display  EKG None   Radiology No results found.  Procedures Procedures (including critical care time)  Medications Ordered in ED Medications - No data to display   Initial Impression / Assessment and Plan / ED Course  I have reviewed the triage vital signs and the nursing notes.  Pertinent labs & imaging results that were available during my care of the Jeremy Brewer were reviewed by me and considered in my medical decision making (see chart for details).        85 yo M with a chief complaint of a rash.  Going on for the past couple months.  Is already seen dermatology for this.  Has had worsening over the past couple days since  stopping steroids.  I will give him a burst dose of steroids.  Suggest that he call his dermatologist for further evaluation.  Clinically it almost looks like he has diffuse poison ivy.  Has some areas of linear distribution and he describes it is very itchy.  No intraoral involvement no fevers.  7:47 PM:  I have discussed the diagnosis/risks/treatment options with the Jeremy Brewer and believe the pt to be eligible for discharge home to follow-up with PCP. We also discussed returning to the ED immediately if new or worsening sx occur. We discussed the sx which are most concerning (e.g., sudden worsening pain, fever, inability to tolerate by mouth) that necessitate immediate return. Medications administered to the Jeremy Brewer during their visit and any new prescriptions provided to the Jeremy Brewer are listed below.  Medications given during this visit Medications - No data to display   The Jeremy Brewer appears reasonably screen and/or stabilized for discharge and I doubt any other medical condition or other [facility] requiring further screening, evaluation, or treatment in the ED at this time prior to discharge.    Final Clinical Impressions(s) / ED Diagnoses   Final diagnoses:  Rash    ED Discharge Orders         Ordered    predniSONE (DELTASONE) 20 MG tablet     08/01/18 1944           Melene Plan, DO 08/01/18 1947

## 2018-08-01 NOTE — ED Triage Notes (Signed)
Pt has had a rash presumably from azithromycin back in middle of March. Yesterday he mowed the lawn, took his shoes off and his feet were swollen. Then he woke up this morning and had blisters, redness and pain in bilateral feet.

## 2018-08-01 NOTE — Discharge Instructions (Signed)
Please call your dermatologist tomorrow and let them know that you have had worsening of your rash.  Return to the emergency department for shortness of breath fever or sudden worsening

## 2018-08-01 NOTE — ED Notes (Signed)
Pt and son verbalize understanding of dc instructions and deny any further needs at this time. Pt is waiting in room for ride since he requires O2

## 2018-08-02 DIAGNOSIS — L120 Bullous pemphigoid: Secondary | ICD-10-CM | POA: Diagnosis not present

## 2018-08-09 DIAGNOSIS — J449 Chronic obstructive pulmonary disease, unspecified: Secondary | ICD-10-CM | POA: Diagnosis not present

## 2018-08-23 DIAGNOSIS — L120 Bullous pemphigoid: Secondary | ICD-10-CM | POA: Diagnosis not present

## 2018-09-04 DIAGNOSIS — Z1211 Encounter for screening for malignant neoplasm of colon: Secondary | ICD-10-CM | POA: Diagnosis not present

## 2018-09-04 DIAGNOSIS — Z0000 Encounter for general adult medical examination without abnormal findings: Secondary | ICD-10-CM | POA: Diagnosis not present

## 2018-09-04 DIAGNOSIS — R54 Age-related physical debility: Secondary | ICD-10-CM | POA: Diagnosis not present

## 2018-09-04 DIAGNOSIS — Z9981 Dependence on supplemental oxygen: Secondary | ICD-10-CM | POA: Diagnosis not present

## 2018-09-04 DIAGNOSIS — Z7189 Other specified counseling: Secondary | ICD-10-CM | POA: Diagnosis not present

## 2018-09-04 DIAGNOSIS — Z125 Encounter for screening for malignant neoplasm of prostate: Secondary | ICD-10-CM | POA: Diagnosis not present

## 2018-09-04 DIAGNOSIS — N183 Chronic kidney disease, stage 3 (moderate): Secondary | ICD-10-CM | POA: Diagnosis not present

## 2018-09-04 DIAGNOSIS — J9611 Chronic respiratory failure with hypoxia: Secondary | ICD-10-CM | POA: Diagnosis not present

## 2018-09-04 DIAGNOSIS — R7989 Other specified abnormal findings of blood chemistry: Secondary | ICD-10-CM | POA: Diagnosis not present

## 2018-09-04 DIAGNOSIS — J449 Chronic obstructive pulmonary disease, unspecified: Secondary | ICD-10-CM | POA: Diagnosis not present

## 2018-09-04 DIAGNOSIS — L120 Bullous pemphigoid: Secondary | ICD-10-CM | POA: Diagnosis not present

## 2018-09-06 DIAGNOSIS — L120 Bullous pemphigoid: Secondary | ICD-10-CM | POA: Diagnosis not present

## 2018-09-09 DIAGNOSIS — J449 Chronic obstructive pulmonary disease, unspecified: Secondary | ICD-10-CM | POA: Diagnosis not present

## 2018-09-24 IMAGING — CT CT CHEST W/O CM
2 of 3 series · 15 of 36 positions shown, 18 images · non-contrast
Comparison: 08/06/2017.  05/07/2017.

CLINICAL DATA: Pulmonary nodules.

EXAM:
CT CHEST WITHOUT CONTRAST
TECHNIQUE: Multidetector CT imaging of the chest was performed following the
standard protocol without IV contrast.

[Series 2: thorax · axial · 0.77mm/px · z∈[-338,-42]mm · 12 of 174 slices shown, 15 images]
[im 13/174  mediastinal]
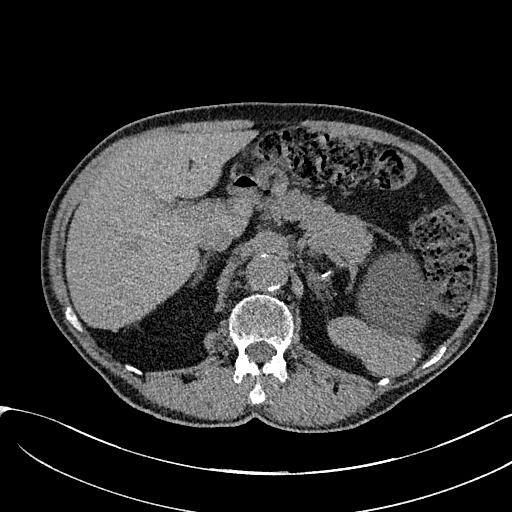
[im 13/174  lung]
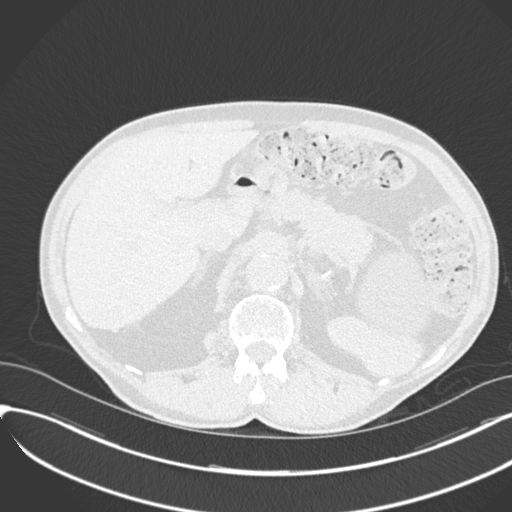
[im 26/174  lung]
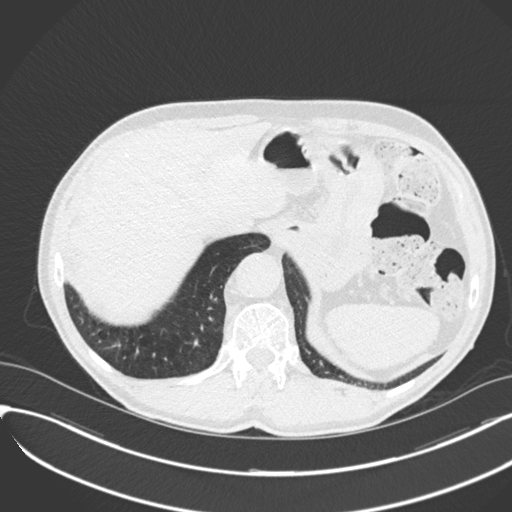
[im 39/174  lung]
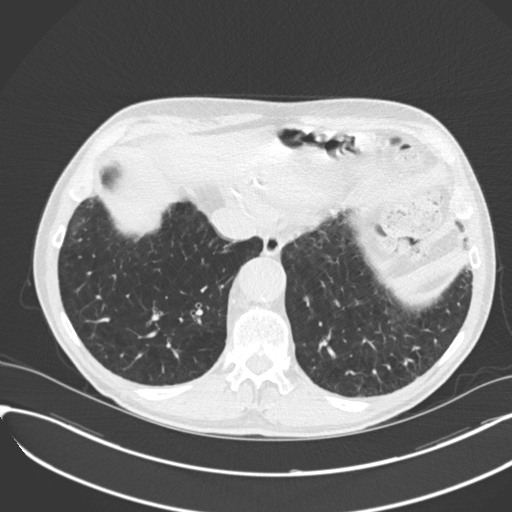
[im 52/174  lung]
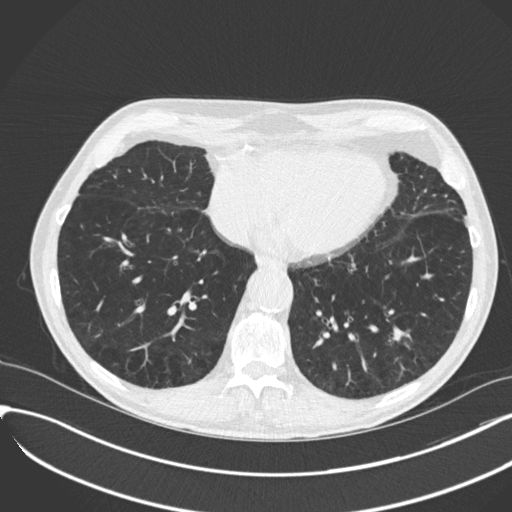
[im 65/174  mediastinal]
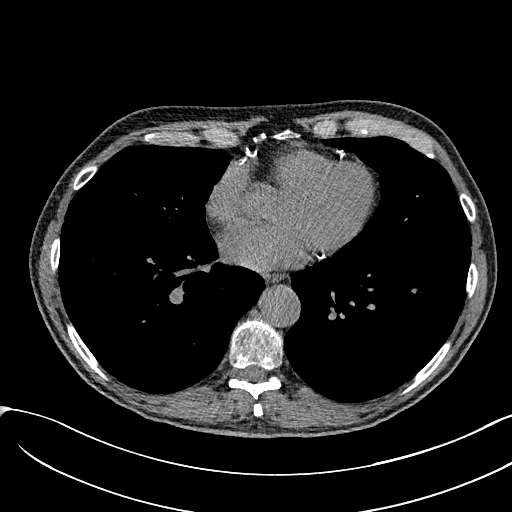
[im 65/174  lung]
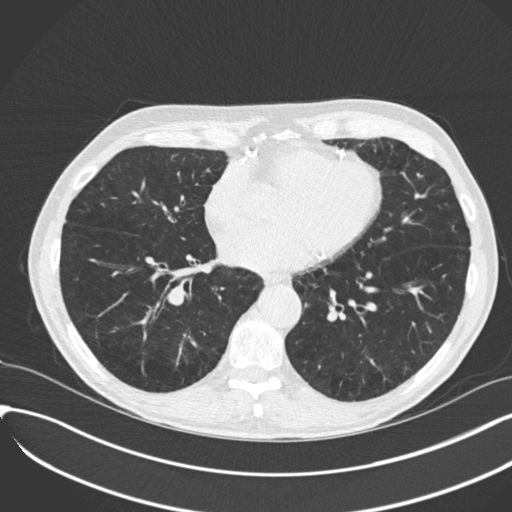
[im 77/174  lung]
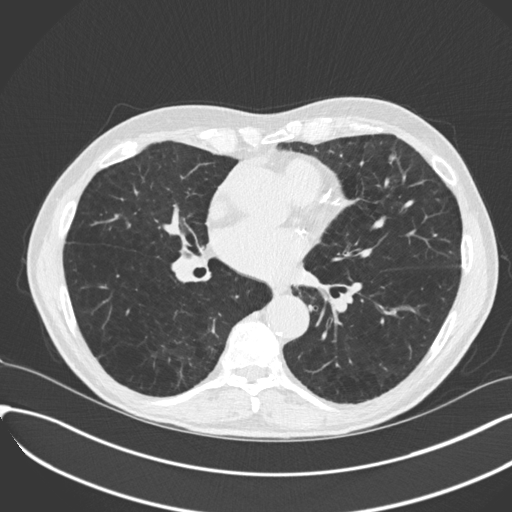
[im 97/174  lung]
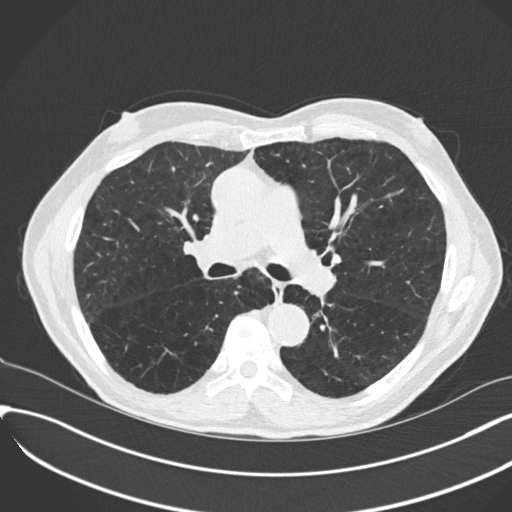
[im 109/174  lung]
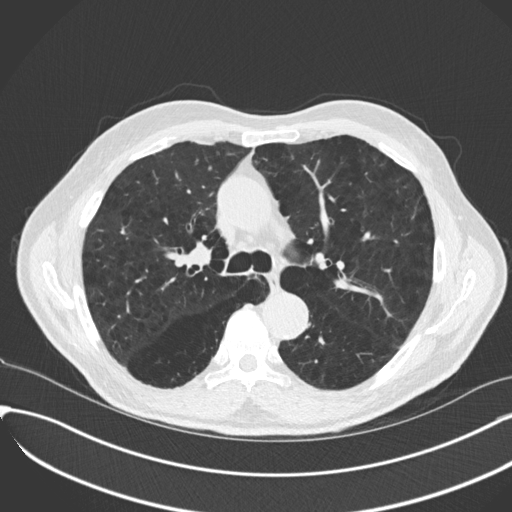
[im 122/174  mediastinal]
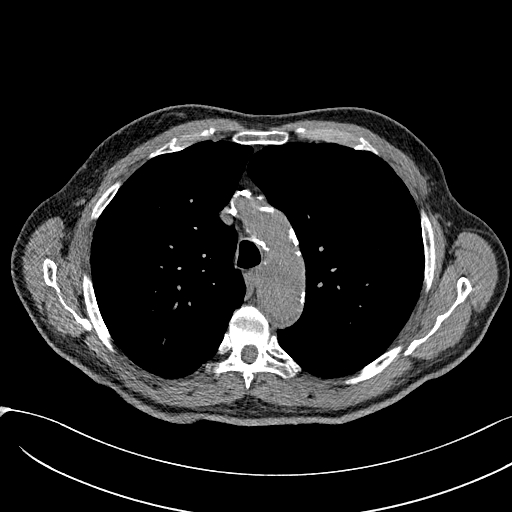
[im 122/174  lung]
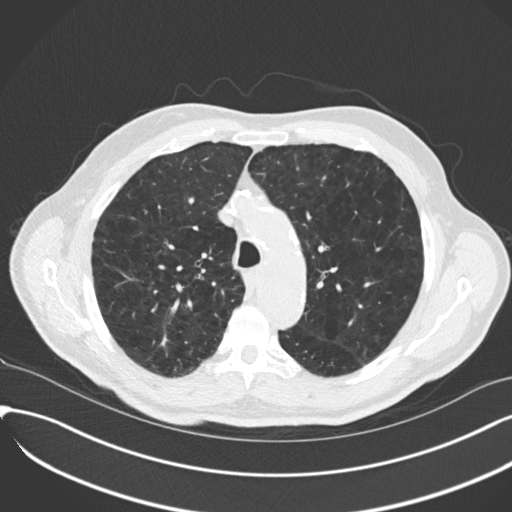
[im 135/174  lung]
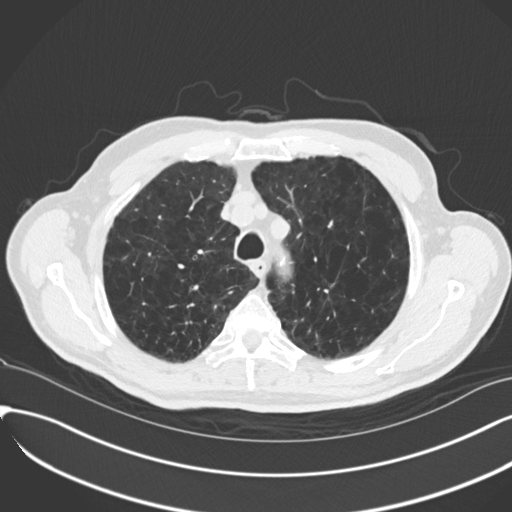
[im 148/174  lung]
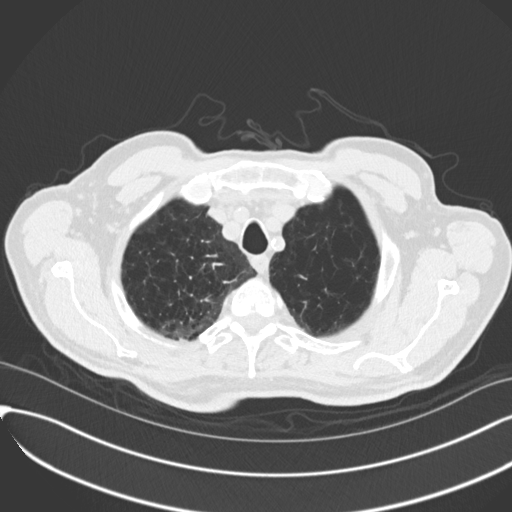
[im 161/174  lung]
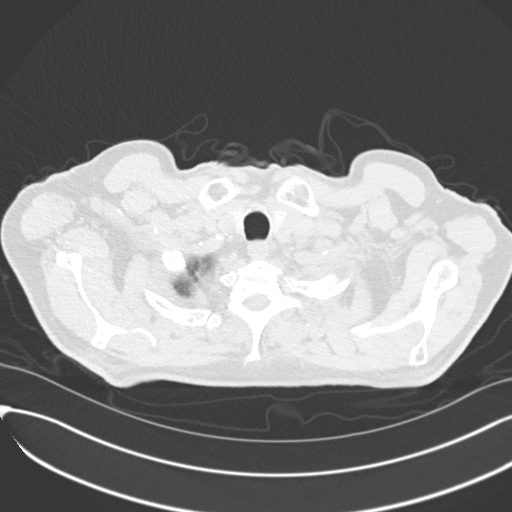

[Series 4: coronal · coronal · 0.71mm/px · 3 of 139 slices shown]
[im 28/139  lung]
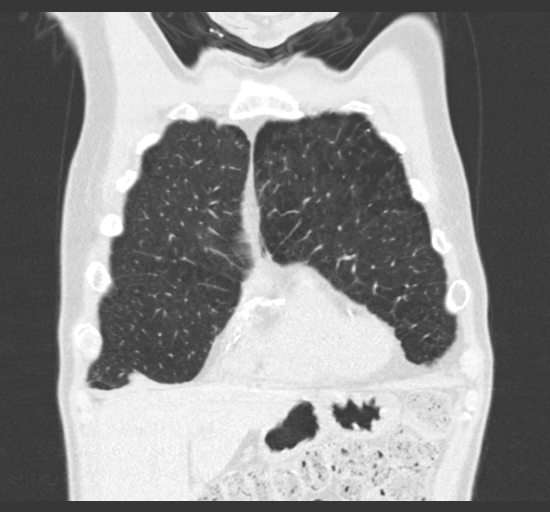
[im 56/139  lung]
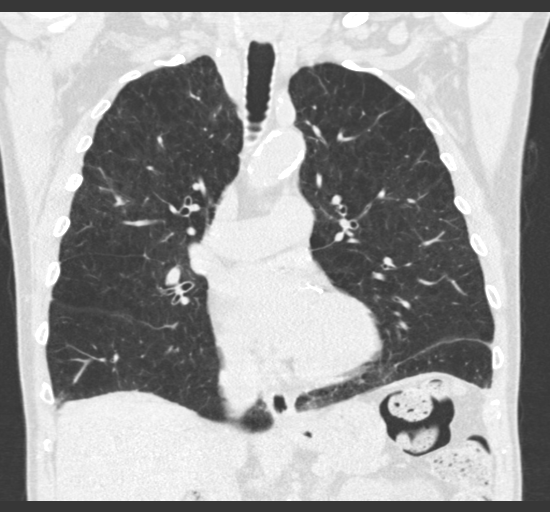
[im 83/139  lung]
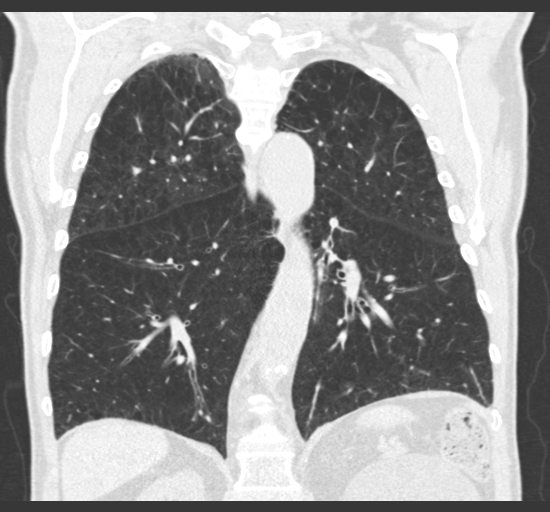

[15 of 36 positions shown; findings below may reference images not displayed]

FINDINGS: Cardiovascular: The heart size is normal. No substantial pericardial
effusion. Coronary artery calcification is evident. Atherosclerotic
calcification is noted in the wall of the thoracic aorta.

Mediastinum/Nodes: No mediastinal lymphadenopathy. No evidence for
gross hilar lymphadenopathy although assessment is limited by the
lack of intravenous contrast on today's study. The esophagus has
normal imaging features. There is no axillary lymphadenopathy.

Lungs/Pleura: The central tracheobronchial airways are patent.
Centrilobular and paraseptal emphysema. 8 mm perifissural nodule
right lung ([DATE]) is stable in the interval. 6 mm nodule in the
lingula ([DATE]) is unchanged. Tiny right middle lobe nodule ([DATE]),
stable. No new suspicious pulmonary nodule or mass. No focal
airspace consolidation. No pulmonary edema or pleural effusion.

Upper Abdomen: Large exophytic cyst left kidney again noted,
incompletely visualized.

Musculoskeletal: No worrisome lytic or sclerotic osseous
abnormality. Symmetric gynecomastia evident.
IMPRESSION: 1. Stable tiny bilateral pulmonary nodules measuring up to 6 mm on
today's study. 6 months of imaging stability is reassuring. Repeat
CT chest in 12-18 months recommended for a high risk patient. This
recommendation follows the consensus statement: Guidelines for
Management of Incidental Pulmonary Nodules Detected on CT Images:
2.  Emphysema. (UK5J1-YLF.4)
3.  Aortic Atherosclerois (UK5J1-170.0)

## 2018-10-09 DIAGNOSIS — J449 Chronic obstructive pulmonary disease, unspecified: Secondary | ICD-10-CM | POA: Diagnosis not present

## 2018-10-24 ENCOUNTER — Telehealth: Payer: Self-pay | Admitting: Pulmonary Disease

## 2018-10-24 MED ORDER — ANORO ELLIPTA 62.5-25 MCG/INH IN AEPB: 1.0000 | INHALATION_SPRAY | Freq: Every day | RESPIRATORY_TRACT | 3 refills | Status: DC

## 2018-10-24 NOTE — Telephone Encounter (Signed)
Refill for anoro has been sent to pt's preferred pharmacy. Called and spoke with pt's daughter Shirlean Mylar letting her know this had been done and Robin verbalized understanding. Nothing further needed.

## 2018-11-08 DIAGNOSIS — L120 Bullous pemphigoid: Secondary | ICD-10-CM | POA: Diagnosis not present

## 2018-11-09 DIAGNOSIS — J449 Chronic obstructive pulmonary disease, unspecified: Secondary | ICD-10-CM | POA: Diagnosis not present

## 2018-12-10 DIAGNOSIS — J449 Chronic obstructive pulmonary disease, unspecified: Secondary | ICD-10-CM | POA: Diagnosis not present

## 2018-12-24 DIAGNOSIS — L120 Bullous pemphigoid: Secondary | ICD-10-CM | POA: Diagnosis not present

## 2019-01-09 DIAGNOSIS — J449 Chronic obstructive pulmonary disease, unspecified: Secondary | ICD-10-CM | POA: Diagnosis not present

## 2019-02-07 DIAGNOSIS — Z23 Encounter for immunization: Secondary | ICD-10-CM | POA: Diagnosis not present

## 2019-02-09 DIAGNOSIS — J449 Chronic obstructive pulmonary disease, unspecified: Secondary | ICD-10-CM | POA: Diagnosis not present

## 2019-02-13 ENCOUNTER — Other Ambulatory Visit: Payer: Self-pay | Admitting: Pulmonary Disease

## 2019-03-11 DIAGNOSIS — J449 Chronic obstructive pulmonary disease, unspecified: Secondary | ICD-10-CM | POA: Diagnosis not present

## 2019-03-14 ENCOUNTER — Other Ambulatory Visit: Payer: Self-pay | Admitting: Adult Health

## 2019-03-14 MED ORDER — ANORO ELLIPTA 62.5-25 MCG/INH IN AEPB: 1.0000 | INHALATION_SPRAY | Freq: Every day | RESPIRATORY_TRACT | 1 refills | Status: DC

## 2019-03-14 NOTE — Telephone Encounter (Signed)
Received faxed refill request from [facility]  Medication name/strength/dose: Anoro Medication last rx'd: 02/13/2019 Quantity and number of refills last rx'd: #1 inhaler with 0 refills  Patient last seen in the office on March 2020 with Dr Lake Bells  Does refill need to be authorized by a provider? no Refill authorized (yes or no)?: yes - does need a visit

## 2019-04-11 DIAGNOSIS — J449 Chronic obstructive pulmonary disease, unspecified: Secondary | ICD-10-CM | POA: Diagnosis not present

## 2019-04-29 DIAGNOSIS — H43393 Other vitreous opacities, bilateral: Secondary | ICD-10-CM | POA: Diagnosis not present

## 2019-04-29 DIAGNOSIS — H5203 Hypermetropia, bilateral: Secondary | ICD-10-CM | POA: Diagnosis not present

## 2019-04-29 DIAGNOSIS — H52203 Unspecified astigmatism, bilateral: Secondary | ICD-10-CM | POA: Diagnosis not present

## 2019-04-29 DIAGNOSIS — H268 Other specified cataract: Secondary | ICD-10-CM | POA: Diagnosis not present

## 2019-04-29 DIAGNOSIS — H2513 Age-related nuclear cataract, bilateral: Secondary | ICD-10-CM | POA: Diagnosis not present

## 2019-04-29 DIAGNOSIS — H02831 Dermatochalasis of right upper eyelid: Secondary | ICD-10-CM | POA: Diagnosis not present

## 2019-04-29 DIAGNOSIS — H02834 Dermatochalasis of left upper eyelid: Secondary | ICD-10-CM | POA: Diagnosis not present

## 2019-04-29 DIAGNOSIS — H524 Presbyopia: Secondary | ICD-10-CM | POA: Diagnosis not present

## 2019-04-29 DIAGNOSIS — Z0100 Encounter for examination of eyes and vision without abnormal findings: Secondary | ICD-10-CM | POA: Diagnosis not present

## 2019-05-12 DIAGNOSIS — J449 Chronic obstructive pulmonary disease, unspecified: Secondary | ICD-10-CM | POA: Diagnosis not present

## 2019-05-23 DIAGNOSIS — R030 Elevated blood-pressure reading, without diagnosis of hypertension: Secondary | ICD-10-CM | POA: Diagnosis not present

## 2019-05-23 DIAGNOSIS — Z7951 Long term (current) use of inhaled steroids: Secondary | ICD-10-CM | POA: Diagnosis not present

## 2019-05-23 DIAGNOSIS — Z9981 Dependence on supplemental oxygen: Secondary | ICD-10-CM | POA: Diagnosis not present

## 2019-05-23 DIAGNOSIS — J3489 Other specified disorders of nose and nasal sinuses: Secondary | ICD-10-CM | POA: Diagnosis not present

## 2019-05-23 DIAGNOSIS — R0602 Shortness of breath: Secondary | ICD-10-CM | POA: Diagnosis not present

## 2019-05-23 DIAGNOSIS — J9611 Chronic respiratory failure with hypoxia: Secondary | ICD-10-CM | POA: Diagnosis not present

## 2019-05-23 DIAGNOSIS — R0982 Postnasal drip: Secondary | ICD-10-CM | POA: Diagnosis not present

## 2019-05-23 DIAGNOSIS — R0682 Tachypnea, not elsewhere classified: Secondary | ICD-10-CM | POA: Diagnosis not present

## 2019-05-23 DIAGNOSIS — J439 Emphysema, unspecified: Secondary | ICD-10-CM | POA: Diagnosis not present

## 2019-05-23 DIAGNOSIS — J449 Chronic obstructive pulmonary disease, unspecified: Secondary | ICD-10-CM | POA: Diagnosis not present

## 2019-05-23 DIAGNOSIS — J441 Chronic obstructive pulmonary disease with (acute) exacerbation: Secondary | ICD-10-CM | POA: Diagnosis not present

## 2019-05-23 DIAGNOSIS — Z881 Allergy status to other antibiotic agents status: Secondary | ICD-10-CM | POA: Diagnosis not present

## 2019-05-23 DIAGNOSIS — I10 Essential (primary) hypertension: Secondary | ICD-10-CM | POA: Diagnosis not present

## 2019-05-30 DIAGNOSIS — I10 Essential (primary) hypertension: Secondary | ICD-10-CM | POA: Diagnosis not present

## 2019-06-09 DIAGNOSIS — J449 Chronic obstructive pulmonary disease, unspecified: Secondary | ICD-10-CM | POA: Diagnosis not present

## 2019-06-17 ENCOUNTER — Other Ambulatory Visit: Payer: Self-pay | Admitting: *Deleted

## 2019-07-10 DIAGNOSIS — J449 Chronic obstructive pulmonary disease, unspecified: Secondary | ICD-10-CM | POA: Diagnosis not present

## 2019-07-30 DIAGNOSIS — J449 Chronic obstructive pulmonary disease, unspecified: Secondary | ICD-10-CM | POA: Diagnosis not present

## 2019-07-30 DIAGNOSIS — I10 Essential (primary) hypertension: Secondary | ICD-10-CM | POA: Diagnosis not present

## 2019-07-30 DIAGNOSIS — Z9981 Dependence on supplemental oxygen: Secondary | ICD-10-CM | POA: Diagnosis not present

## 2019-07-30 DIAGNOSIS — J9611 Chronic respiratory failure with hypoxia: Secondary | ICD-10-CM | POA: Diagnosis not present

## 2019-07-30 DIAGNOSIS — I700 Atherosclerosis of aorta: Secondary | ICD-10-CM | POA: Diagnosis not present

## 2019-08-09 DIAGNOSIS — J449 Chronic obstructive pulmonary disease, unspecified: Secondary | ICD-10-CM | POA: Diagnosis not present

## 2019-08-26 DIAGNOSIS — Z1159 Encounter for screening for other viral diseases: Secondary | ICD-10-CM | POA: Diagnosis not present

## 2019-08-26 DIAGNOSIS — Z0000 Encounter for general adult medical examination without abnormal findings: Secondary | ICD-10-CM | POA: Diagnosis not present

## 2019-08-26 DIAGNOSIS — I10 Essential (primary) hypertension: Secondary | ICD-10-CM | POA: Diagnosis not present

## 2019-09-09 DIAGNOSIS — J449 Chronic obstructive pulmonary disease, unspecified: Secondary | ICD-10-CM | POA: Diagnosis not present

## 2019-09-27 DIAGNOSIS — R069 Unspecified abnormalities of breathing: Secondary | ICD-10-CM | POA: Diagnosis not present

## 2019-09-27 DIAGNOSIS — R0603 Acute respiratory distress: Secondary | ICD-10-CM | POA: Diagnosis not present

## 2019-09-27 DIAGNOSIS — R062 Wheezing: Secondary | ICD-10-CM | POA: Diagnosis not present

## 2019-09-27 DIAGNOSIS — Z79899 Other long term (current) drug therapy: Secondary | ICD-10-CM | POA: Diagnosis not present

## 2019-09-27 DIAGNOSIS — J80 Acute respiratory distress syndrome: Secondary | ICD-10-CM | POA: Diagnosis not present

## 2019-09-27 DIAGNOSIS — R079 Chest pain, unspecified: Secondary | ICD-10-CM | POA: Diagnosis not present

## 2019-09-27 DIAGNOSIS — J441 Chronic obstructive pulmonary disease with (acute) exacerbation: Secondary | ICD-10-CM | POA: Diagnosis not present

## 2019-09-27 DIAGNOSIS — Z883 Allergy status to other anti-infective agents status: Secondary | ICD-10-CM | POA: Diagnosis not present

## 2019-09-27 DIAGNOSIS — R9431 Abnormal electrocardiogram [ECG] [EKG]: Secondary | ICD-10-CM | POA: Diagnosis not present

## 2019-09-27 DIAGNOSIS — R0602 Shortness of breath: Secondary | ICD-10-CM | POA: Diagnosis not present

## 2019-09-27 DIAGNOSIS — R05 Cough: Secondary | ICD-10-CM | POA: Diagnosis not present

## 2019-09-27 DIAGNOSIS — Z20822 Contact with and (suspected) exposure to covid-19: Secondary | ICD-10-CM | POA: Diagnosis not present

## 2019-09-27 DIAGNOSIS — I10 Essential (primary) hypertension: Secondary | ICD-10-CM | POA: Diagnosis not present

## 2019-10-09 DIAGNOSIS — J449 Chronic obstructive pulmonary disease, unspecified: Secondary | ICD-10-CM | POA: Diagnosis not present

## 2019-11-09 DIAGNOSIS — J449 Chronic obstructive pulmonary disease, unspecified: Secondary | ICD-10-CM | POA: Diagnosis not present

## 2019-11-25 DIAGNOSIS — J449 Chronic obstructive pulmonary disease, unspecified: Secondary | ICD-10-CM | POA: Diagnosis not present

## 2019-11-25 DIAGNOSIS — J439 Emphysema, unspecified: Secondary | ICD-10-CM | POA: Diagnosis not present

## 2019-11-25 DIAGNOSIS — N183 Chronic kidney disease, stage 3 unspecified: Secondary | ICD-10-CM | POA: Diagnosis not present

## 2019-11-25 DIAGNOSIS — R54 Age-related physical debility: Secondary | ICD-10-CM | POA: Diagnosis not present

## 2019-11-25 DIAGNOSIS — I10 Essential (primary) hypertension: Secondary | ICD-10-CM | POA: Diagnosis not present

## 2019-12-10 DIAGNOSIS — J449 Chronic obstructive pulmonary disease, unspecified: Secondary | ICD-10-CM | POA: Diagnosis not present

## 2019-12-25 DIAGNOSIS — R54 Age-related physical debility: Secondary | ICD-10-CM | POA: Diagnosis not present

## 2019-12-25 DIAGNOSIS — N183 Chronic kidney disease, stage 3 unspecified: Secondary | ICD-10-CM | POA: Diagnosis not present

## 2019-12-25 DIAGNOSIS — J449 Chronic obstructive pulmonary disease, unspecified: Secondary | ICD-10-CM | POA: Diagnosis not present

## 2019-12-25 DIAGNOSIS — J439 Emphysema, unspecified: Secondary | ICD-10-CM | POA: Diagnosis not present

## 2019-12-25 DIAGNOSIS — I10 Essential (primary) hypertension: Secondary | ICD-10-CM | POA: Diagnosis not present

## 2019-12-31 DIAGNOSIS — R059 Cough, unspecified: Secondary | ICD-10-CM | POA: Diagnosis not present

## 2019-12-31 DIAGNOSIS — J984 Other disorders of lung: Secondary | ICD-10-CM | POA: Diagnosis not present

## 2019-12-31 DIAGNOSIS — Z881 Allergy status to other antibiotic agents status: Secondary | ICD-10-CM | POA: Diagnosis not present

## 2019-12-31 DIAGNOSIS — J441 Chronic obstructive pulmonary disease with (acute) exacerbation: Secondary | ICD-10-CM | POA: Diagnosis not present

## 2019-12-31 DIAGNOSIS — R0602 Shortness of breath: Secondary | ICD-10-CM | POA: Diagnosis not present

## 2019-12-31 DIAGNOSIS — Z79899 Other long term (current) drug therapy: Secondary | ICD-10-CM | POA: Diagnosis not present

## 2020-01-09 DIAGNOSIS — J449 Chronic obstructive pulmonary disease, unspecified: Secondary | ICD-10-CM | POA: Diagnosis not present

## 2020-01-30 DIAGNOSIS — G8929 Other chronic pain: Secondary | ICD-10-CM | POA: Diagnosis not present

## 2020-01-30 DIAGNOSIS — J439 Emphysema, unspecified: Secondary | ICD-10-CM | POA: Diagnosis not present

## 2020-01-30 DIAGNOSIS — R54 Age-related physical debility: Secondary | ICD-10-CM | POA: Diagnosis not present

## 2020-01-30 DIAGNOSIS — N183 Chronic kidney disease, stage 3 unspecified: Secondary | ICD-10-CM | POA: Diagnosis not present

## 2020-01-30 DIAGNOSIS — J449 Chronic obstructive pulmonary disease, unspecified: Secondary | ICD-10-CM | POA: Diagnosis not present

## 2020-01-30 DIAGNOSIS — I10 Essential (primary) hypertension: Secondary | ICD-10-CM | POA: Diagnosis not present

## 2020-02-09 DIAGNOSIS — J449 Chronic obstructive pulmonary disease, unspecified: Secondary | ICD-10-CM | POA: Diagnosis not present

## 2020-02-25 DIAGNOSIS — Z23 Encounter for immunization: Secondary | ICD-10-CM | POA: Diagnosis not present

## 2020-02-25 DIAGNOSIS — I10 Essential (primary) hypertension: Secondary | ICD-10-CM | POA: Diagnosis not present

## 2020-02-25 DIAGNOSIS — J449 Chronic obstructive pulmonary disease, unspecified: Secondary | ICD-10-CM | POA: Diagnosis not present

## 2020-03-10 DIAGNOSIS — J449 Chronic obstructive pulmonary disease, unspecified: Secondary | ICD-10-CM | POA: Diagnosis not present

## 2020-04-10 DIAGNOSIS — J449 Chronic obstructive pulmonary disease, unspecified: Secondary | ICD-10-CM | POA: Diagnosis not present

## 2020-04-12 DIAGNOSIS — J9621 Acute and chronic respiratory failure with hypoxia: Secondary | ICD-10-CM | POA: Diagnosis not present

## 2020-04-12 DIAGNOSIS — R778 Other specified abnormalities of plasma proteins: Secondary | ICD-10-CM | POA: Diagnosis not present

## 2020-04-12 DIAGNOSIS — R918 Other nonspecific abnormal finding of lung field: Secondary | ICD-10-CM | POA: Diagnosis not present

## 2020-04-12 DIAGNOSIS — I4891 Unspecified atrial fibrillation: Secondary | ICD-10-CM | POA: Diagnosis not present

## 2020-04-12 DIAGNOSIS — R0902 Hypoxemia: Secondary | ICD-10-CM | POA: Diagnosis not present

## 2020-04-12 DIAGNOSIS — J156 Pneumonia due to other aerobic Gram-negative bacteria: Secondary | ICD-10-CM | POA: Diagnosis not present

## 2020-04-12 DIAGNOSIS — N179 Acute kidney failure, unspecified: Secondary | ICD-10-CM | POA: Diagnosis not present

## 2020-04-12 DIAGNOSIS — J9602 Acute respiratory failure with hypercapnia: Secondary | ICD-10-CM | POA: Diagnosis not present

## 2020-04-12 DIAGNOSIS — I2510 Atherosclerotic heart disease of native coronary artery without angina pectoris: Secondary | ICD-10-CM | POA: Diagnosis not present

## 2020-04-12 DIAGNOSIS — I2699 Other pulmonary embolism without acute cor pulmonale: Secondary | ICD-10-CM | POA: Diagnosis not present

## 2020-04-12 DIAGNOSIS — R7989 Other specified abnormal findings of blood chemistry: Secondary | ICD-10-CM | POA: Diagnosis not present

## 2020-04-12 DIAGNOSIS — J168 Pneumonia due to other specified infectious organisms: Secondary | ICD-10-CM | POA: Diagnosis not present

## 2020-04-12 DIAGNOSIS — Z743 Need for continuous supervision: Secondary | ICD-10-CM | POA: Diagnosis not present

## 2020-04-12 DIAGNOSIS — J189 Pneumonia, unspecified organism: Secondary | ICD-10-CM | POA: Diagnosis not present

## 2020-04-12 DIAGNOSIS — I359 Nonrheumatic aortic valve disorder, unspecified: Secondary | ICD-10-CM | POA: Diagnosis not present

## 2020-04-12 DIAGNOSIS — R4182 Altered mental status, unspecified: Secondary | ICD-10-CM | POA: Diagnosis not present

## 2020-04-12 DIAGNOSIS — A419 Sepsis, unspecified organism: Secondary | ICD-10-CM | POA: Diagnosis not present

## 2020-04-12 DIAGNOSIS — J9601 Acute respiratory failure with hypoxia: Secondary | ICD-10-CM | POA: Diagnosis not present

## 2020-04-12 DIAGNOSIS — J159 Unspecified bacterial pneumonia: Secondary | ICD-10-CM | POA: Diagnosis not present

## 2020-04-12 DIAGNOSIS — I214 Non-ST elevation (NSTEMI) myocardial infarction: Secondary | ICD-10-CM | POA: Diagnosis not present

## 2020-04-12 DIAGNOSIS — I959 Hypotension, unspecified: Secondary | ICD-10-CM | POA: Diagnosis not present

## 2020-04-12 DIAGNOSIS — J9622 Acute and chronic respiratory failure with hypercapnia: Secondary | ICD-10-CM | POA: Diagnosis not present

## 2020-04-12 DIAGNOSIS — Z20822 Contact with and (suspected) exposure to covid-19: Secondary | ICD-10-CM | POA: Diagnosis not present

## 2020-04-12 DIAGNOSIS — R6520 Severe sepsis without septic shock: Secondary | ICD-10-CM | POA: Diagnosis not present

## 2020-04-12 DIAGNOSIS — R062 Wheezing: Secondary | ICD-10-CM | POA: Diagnosis not present

## 2020-04-12 DIAGNOSIS — R0689 Other abnormalities of breathing: Secondary | ICD-10-CM | POA: Diagnosis not present

## 2020-04-12 DIAGNOSIS — I2584 Coronary atherosclerosis due to calcified coronary lesion: Secondary | ICD-10-CM | POA: Diagnosis not present

## 2020-04-12 DIAGNOSIS — I252 Old myocardial infarction: Secondary | ICD-10-CM | POA: Diagnosis not present

## 2020-04-12 DIAGNOSIS — N1832 Chronic kidney disease, stage 3b: Secondary | ICD-10-CM | POA: Diagnosis not present

## 2020-04-12 DIAGNOSIS — R6521 Severe sepsis with septic shock: Secondary | ICD-10-CM | POA: Diagnosis not present

## 2020-04-12 DIAGNOSIS — J441 Chronic obstructive pulmonary disease with (acute) exacerbation: Secondary | ICD-10-CM | POA: Diagnosis not present

## 2020-04-13 DIAGNOSIS — N179 Acute kidney failure, unspecified: Secondary | ICD-10-CM | POA: Diagnosis not present

## 2020-04-13 DIAGNOSIS — J9621 Acute and chronic respiratory failure with hypoxia: Secondary | ICD-10-CM | POA: Diagnosis not present

## 2020-04-13 DIAGNOSIS — J441 Chronic obstructive pulmonary disease with (acute) exacerbation: Secondary | ICD-10-CM | POA: Diagnosis not present

## 2020-04-13 DIAGNOSIS — J9622 Acute and chronic respiratory failure with hypercapnia: Secondary | ICD-10-CM | POA: Diagnosis not present

## 2020-04-13 DIAGNOSIS — R7989 Other specified abnormal findings of blood chemistry: Secondary | ICD-10-CM | POA: Diagnosis not present

## 2020-04-13 DIAGNOSIS — I214 Non-ST elevation (NSTEMI) myocardial infarction: Secondary | ICD-10-CM | POA: Diagnosis not present

## 2020-04-13 DIAGNOSIS — I359 Nonrheumatic aortic valve disorder, unspecified: Secondary | ICD-10-CM | POA: Diagnosis not present

## 2020-04-16 DIAGNOSIS — J441 Chronic obstructive pulmonary disease with (acute) exacerbation: Secondary | ICD-10-CM | POA: Diagnosis not present

## 2020-04-16 DIAGNOSIS — N1832 Chronic kidney disease, stage 3b: Secondary | ICD-10-CM | POA: Diagnosis not present

## 2020-04-16 DIAGNOSIS — I2699 Other pulmonary embolism without acute cor pulmonale: Secondary | ICD-10-CM | POA: Diagnosis not present

## 2020-04-16 DIAGNOSIS — I2584 Coronary atherosclerosis due to calcified coronary lesion: Secondary | ICD-10-CM | POA: Diagnosis not present

## 2020-04-16 DIAGNOSIS — I214 Non-ST elevation (NSTEMI) myocardial infarction: Secondary | ICD-10-CM | POA: Diagnosis not present

## 2020-04-16 DIAGNOSIS — I2510 Atherosclerotic heart disease of native coronary artery without angina pectoris: Secondary | ICD-10-CM | POA: Diagnosis not present

## 2020-04-16 DIAGNOSIS — J9621 Acute and chronic respiratory failure with hypoxia: Secondary | ICD-10-CM | POA: Diagnosis not present

## 2020-04-16 DIAGNOSIS — J9622 Acute and chronic respiratory failure with hypercapnia: Secondary | ICD-10-CM | POA: Diagnosis not present

## 2020-04-23 DIAGNOSIS — Z9981 Dependence on supplemental oxygen: Secondary | ICD-10-CM | POA: Diagnosis not present

## 2020-04-23 DIAGNOSIS — J449 Chronic obstructive pulmonary disease, unspecified: Secondary | ICD-10-CM | POA: Diagnosis not present

## 2020-04-23 DIAGNOSIS — J9600 Acute respiratory failure, unspecified whether with hypoxia or hypercapnia: Secondary | ICD-10-CM | POA: Diagnosis not present

## 2020-04-23 DIAGNOSIS — J9611 Chronic respiratory failure with hypoxia: Secondary | ICD-10-CM | POA: Diagnosis not present

## 2020-04-23 DIAGNOSIS — I10 Essential (primary) hypertension: Secondary | ICD-10-CM | POA: Diagnosis not present

## 2020-05-11 DIAGNOSIS — J449 Chronic obstructive pulmonary disease, unspecified: Secondary | ICD-10-CM | POA: Diagnosis not present

## 2020-06-08 DIAGNOSIS — J449 Chronic obstructive pulmonary disease, unspecified: Secondary | ICD-10-CM | POA: Diagnosis not present

## 2020-07-09 DIAGNOSIS — J449 Chronic obstructive pulmonary disease, unspecified: Secondary | ICD-10-CM | POA: Diagnosis not present

## 2020-07-30 DIAGNOSIS — J439 Emphysema, unspecified: Secondary | ICD-10-CM | POA: Diagnosis not present

## 2020-08-08 DIAGNOSIS — J449 Chronic obstructive pulmonary disease, unspecified: Secondary | ICD-10-CM | POA: Diagnosis not present

## 2020-08-26 DIAGNOSIS — Z0000 Encounter for general adult medical examination without abnormal findings: Secondary | ICD-10-CM | POA: Diagnosis not present

## 2020-08-26 DIAGNOSIS — J449 Chronic obstructive pulmonary disease, unspecified: Secondary | ICD-10-CM | POA: Diagnosis not present

## 2020-08-26 DIAGNOSIS — I10 Essential (primary) hypertension: Secondary | ICD-10-CM | POA: Diagnosis not present

## 2020-08-26 DIAGNOSIS — H6123 Impacted cerumen, bilateral: Secondary | ICD-10-CM | POA: Diagnosis not present

## 2020-08-28 DIAGNOSIS — E872 Acidosis: Secondary | ICD-10-CM | POA: Diagnosis not present

## 2020-08-28 DIAGNOSIS — Z881 Allergy status to other antibiotic agents status: Secondary | ICD-10-CM | POA: Diagnosis not present

## 2020-08-28 DIAGNOSIS — R059 Cough, unspecified: Secondary | ICD-10-CM | POA: Diagnosis not present

## 2020-08-28 DIAGNOSIS — Z20822 Contact with and (suspected) exposure to covid-19: Secondary | ICD-10-CM | POA: Diagnosis not present

## 2020-08-28 DIAGNOSIS — J441 Chronic obstructive pulmonary disease with (acute) exacerbation: Secondary | ICD-10-CM | POA: Diagnosis not present

## 2020-08-28 DIAGNOSIS — Z7982 Long term (current) use of aspirin: Secondary | ICD-10-CM | POA: Diagnosis not present

## 2020-08-28 DIAGNOSIS — J439 Emphysema, unspecified: Secondary | ICD-10-CM | POA: Diagnosis not present

## 2020-08-28 DIAGNOSIS — R5383 Other fatigue: Secondary | ICD-10-CM | POA: Diagnosis not present

## 2020-08-28 DIAGNOSIS — R0602 Shortness of breath: Secondary | ICD-10-CM | POA: Diagnosis not present

## 2020-08-28 DIAGNOSIS — Z87891 Personal history of nicotine dependence: Secondary | ICD-10-CM | POA: Diagnosis not present

## 2020-08-28 DIAGNOSIS — Z9981 Dependence on supplemental oxygen: Secondary | ICD-10-CM | POA: Diagnosis not present

## 2020-09-08 DIAGNOSIS — J449 Chronic obstructive pulmonary disease, unspecified: Secondary | ICD-10-CM | POA: Diagnosis not present

## 2020-09-24 DIAGNOSIS — Z87891 Personal history of nicotine dependence: Secondary | ICD-10-CM | POA: Diagnosis not present

## 2020-09-24 DIAGNOSIS — Z7982 Long term (current) use of aspirin: Secondary | ICD-10-CM | POA: Diagnosis not present

## 2020-09-24 DIAGNOSIS — R079 Chest pain, unspecified: Secondary | ICD-10-CM | POA: Diagnosis not present

## 2020-09-24 DIAGNOSIS — Z7901 Long term (current) use of anticoagulants: Secondary | ICD-10-CM | POA: Diagnosis not present

## 2020-09-24 DIAGNOSIS — Z881 Allergy status to other antibiotic agents status: Secondary | ICD-10-CM | POA: Diagnosis not present

## 2020-09-24 DIAGNOSIS — J441 Chronic obstructive pulmonary disease with (acute) exacerbation: Secondary | ICD-10-CM | POA: Diagnosis not present

## 2020-09-24 DIAGNOSIS — R0602 Shortness of breath: Secondary | ICD-10-CM | POA: Diagnosis not present

## 2020-10-08 DIAGNOSIS — J449 Chronic obstructive pulmonary disease, unspecified: Secondary | ICD-10-CM | POA: Diagnosis not present

## 2020-11-08 DIAGNOSIS — J449 Chronic obstructive pulmonary disease, unspecified: Secondary | ICD-10-CM | POA: Diagnosis not present

## 2020-12-09 DIAGNOSIS — J449 Chronic obstructive pulmonary disease, unspecified: Secondary | ICD-10-CM | POA: Diagnosis not present

## 2021-01-08 DIAGNOSIS — J449 Chronic obstructive pulmonary disease, unspecified: Secondary | ICD-10-CM | POA: Diagnosis not present

## 2021-02-08 DIAGNOSIS — J449 Chronic obstructive pulmonary disease, unspecified: Secondary | ICD-10-CM | POA: Diagnosis not present

## 2021-02-24 DIAGNOSIS — I700 Atherosclerosis of aorta: Secondary | ICD-10-CM | POA: Diagnosis not present

## 2021-02-24 DIAGNOSIS — Z9981 Dependence on supplemental oxygen: Secondary | ICD-10-CM | POA: Diagnosis not present

## 2021-02-24 DIAGNOSIS — J9611 Chronic respiratory failure with hypoxia: Secondary | ICD-10-CM | POA: Diagnosis not present

## 2021-02-24 DIAGNOSIS — R54 Age-related physical debility: Secondary | ICD-10-CM | POA: Diagnosis not present

## 2021-02-24 DIAGNOSIS — N183 Chronic kidney disease, stage 3 unspecified: Secondary | ICD-10-CM | POA: Diagnosis not present

## 2021-02-24 DIAGNOSIS — J449 Chronic obstructive pulmonary disease, unspecified: Secondary | ICD-10-CM | POA: Diagnosis not present

## 2021-02-24 DIAGNOSIS — E875 Hyperkalemia: Secondary | ICD-10-CM | POA: Diagnosis not present

## 2021-02-24 DIAGNOSIS — I10 Essential (primary) hypertension: Secondary | ICD-10-CM | POA: Diagnosis not present

## 2021-02-28 DIAGNOSIS — E875 Hyperkalemia: Secondary | ICD-10-CM | POA: Diagnosis not present

## 2021-03-10 DIAGNOSIS — J449 Chronic obstructive pulmonary disease, unspecified: Secondary | ICD-10-CM | POA: Diagnosis not present

## 2021-04-10 DIAGNOSIS — J449 Chronic obstructive pulmonary disease, unspecified: Secondary | ICD-10-CM | POA: Diagnosis not present

## 2021-05-11 DIAGNOSIS — J449 Chronic obstructive pulmonary disease, unspecified: Secondary | ICD-10-CM | POA: Diagnosis not present

## 2021-06-08 DIAGNOSIS — J449 Chronic obstructive pulmonary disease, unspecified: Secondary | ICD-10-CM | POA: Diagnosis not present

## 2021-07-09 DIAGNOSIS — J449 Chronic obstructive pulmonary disease, unspecified: Secondary | ICD-10-CM | POA: Diagnosis not present

## 2021-08-08 DIAGNOSIS — J449 Chronic obstructive pulmonary disease, unspecified: Secondary | ICD-10-CM | POA: Diagnosis not present

## 2021-09-08 DIAGNOSIS — J449 Chronic obstructive pulmonary disease, unspecified: Secondary | ICD-10-CM | POA: Diagnosis not present

## 2021-10-08 DIAGNOSIS — J449 Chronic obstructive pulmonary disease, unspecified: Secondary | ICD-10-CM | POA: Diagnosis not present

## 2021-11-01 DIAGNOSIS — J449 Chronic obstructive pulmonary disease, unspecified: Secondary | ICD-10-CM | POA: Diagnosis not present

## 2021-11-01 DIAGNOSIS — R54 Age-related physical debility: Secondary | ICD-10-CM | POA: Diagnosis not present

## 2021-11-01 DIAGNOSIS — I700 Atherosclerosis of aorta: Secondary | ICD-10-CM | POA: Diagnosis not present

## 2021-11-01 DIAGNOSIS — Z0000 Encounter for general adult medical examination without abnormal findings: Secondary | ICD-10-CM | POA: Diagnosis not present

## 2021-11-01 DIAGNOSIS — J9611 Chronic respiratory failure with hypoxia: Secondary | ICD-10-CM | POA: Diagnosis not present

## 2021-11-08 DIAGNOSIS — J449 Chronic obstructive pulmonary disease, unspecified: Secondary | ICD-10-CM | POA: Diagnosis not present

## 2021-11-20 NOTE — Progress Notes (Unsigned)
Synopsis: Referred for COPD by Joycelyn Rua, MD  Subjective:   PATIENT ID: Jeremy Brewer GENDER: male DOB: 01-05-1931, MRN: [id number]  No chief complaint on file.  89yM with history of COPD on anoro, azithro, PE 03/2020  Otherwise pertinent review of systems is negative.  Past Medical History:  Diagnosis Date   COPD (chronic obstructive pulmonary disease) (HCC)      Family History  Problem Relation Age of Onset   Diabetes Mellitus II Neg Hx    CAD Neg Hx      Past Surgical History:  Procedure Laterality Date   APPENDECTOMY      Social History   Socioeconomic History   Marital status: Married    Spouse name: Not on file   Number of children: Not on file   Years of education: Not on file   Highest education level: Not on file  Occupational History   Not on file  Tobacco Use   Smoking status: Former    Packs/day: 2.00    Types: Cigarettes    Quit date: 2016    Years since quitting: 7.6   Smokeless tobacco: Never  Vaping Use   Vaping Use: Never used  Substance and Sexual Activity   Alcohol use: No   Drug use: No   Sexual activity: Not on file  Other Topics Concern   Not on file  Social History Narrative   Not on file   Social Determinants of Health   Financial Resource Strain: Not on file  Food Insecurity: Not on file  Transportation Needs: Not on file  Physical Activity: Not on file  Stress: Not on file  Social Connections: Not on file  Intimate Partner Violence: Not on file     Allergies  Allergen Reactions   Azithromycin Rash     Outpatient Medications Prior to Visit  Medication Sig Dispense Refill   ALCLOMETASONE DIPROPIONATE EX Apply topically.     azithromycin (ZITHROMAX) 250 MG tablet Take 1 tablet (250 mg total) by mouth daily. 30 tablet 1   ipratropium-albuterol (DUONEB) 0.5-2.5 (3) MG/3ML SOLN Take 3 mLs by nebulization every 6 (six) hours as needed. (Patient taking differently: Take 3 mLs by nebulization every 6 (six) hours as  needed (shortness of breath/wheezing). ) 360 mL 2   mupirocin cream (BACTROBAN) 2 % Apply 1 application topically 2 (two) times daily.     OXYGEN Inhale 3 L into the lungs continuous.      predniSONE (DELTASONE) 20 MG tablet 2 tabs po daily x 4 days 8 tablet 0   triamcinolone cream (KENALOG) 0.5 % Apply 1 application topically 3 (three) times daily.     umeclidinium-vilanterol (ANORO ELLIPTA) 62.5-25 MCG/INH AEPB Inhale 1 puff into the lungs daily. NEEDS APPT FOR REFILLS 60 each 1   VENTOLIN HFA 108 (90 Base) MCG/ACT inhaler INHALE 2 PUFFS INTO THE LUNGS EVERY 6 HOURS AS NEEDED FOR WHEEZING OR SHORTNESS OF BREATH 18 g 5   No facility-administered medications prior to visit.       Objective:   Physical Exam:  General appearance: 88 y.o., male, NAD, conversant  Eyes: anicteric sclerae; PERRL, tracking appropriately HENT: NCAT; MMM Neck: Trachea midline; no lymphadenopathy, no JVD Lungs: CTAB, no crackles, no wheeze, with normal respiratory effort CV: RRR, no murmur  Abdomen: Soft, non-tender; non-distended, BS present  Extremities: No peripheral edema, warm Skin: Normal turgor and texture; no rash Psych: Appropriate affect Neuro: Alert and oriented to person and place, no focal deficit  There were no vitals filed for this visit.   on *** LPM *** RA BMI Readings from Last 3 Encounters:  08/01/18 23.65 kg/m  06/03/18 22.13 kg/m  12/27/17 22.13 kg/m   Wt Readings from Last 3 Encounters:  08/01/18 151 lb (68.5 kg)  12/27/17 141 lb 5 oz (64.1 kg)  11/12/17 154 lb (69.9 kg)     CBC    Component Value Date/Time   WBC 9.0 12/28/2017 0008   RBC 4.68 12/28/2017 0008   HGB 13.8 12/28/2017 0008   HCT 42.1 12/28/2017 0008   PLT 251 12/28/2017 0008   MCV 90.0 12/28/2017 0008   MCH 29.5 12/28/2017 0008   MCHC 32.8 12/28/2017 0008   RDW 12.7 12/28/2017 0008   LYMPHSABS 3.8 12/27/2017 1856   MONOABS 2.3 (H) 12/27/2017 1856   EOSABS 0.0 12/27/2017 1856   BASOSABS 0.2  (H) 12/27/2017 1856    ***  Chest Imaging: CT Chest 2020 reviewed by me with mucoid impaction in lung bases, secretions in dependent airways, upper lobe predominant emphysema, calcified subcarinal LN  V/Q scan 04/12/20 high prob PE  US DVT 04/12/20: IMPRESSION:  No evidence of deep venous thrombosis.   Pulmonary Functions Testing Results:     No data to display         Cleda Daub 2019 reviewed by me very severe obstruction, reduced FVC  FeNO: ***  Pathology: ***  Echocardiogram:   TTE 2022: Left Ventricle: Systolic function is normal. EF: 55-60%.    Left Ventricle: Normal left ventricular size.    Left Ventricle: Wall thickness is normal.    Left Ventricle: Doppler parameters consistent with mild diastolic  dysfunction and low to normal LA pressure.  Heart Catheterization: ***    Assessment & Plan:    Plan:      Omar Person, MD [facility] 11/20/2021 6:43 PM

## 2021-11-23 ENCOUNTER — Encounter: Payer: Self-pay | Admitting: Student

## 2021-11-23 ENCOUNTER — Ambulatory Visit: Payer: Medicare HMO | Admitting: Student

## 2021-11-23 VITALS — BP 130/72 | HR 90 | Temp 97.7°F | Ht 67.0 in | Wt 147.0 lb

## 2021-11-23 DIAGNOSIS — J449 Chronic obstructive pulmonary disease, unspecified: Secondary | ICD-10-CM | POA: Diagnosis not present

## 2021-11-23 DIAGNOSIS — J9611 Chronic respiratory failure with hypoxia: Secondary | ICD-10-CM | POA: Diagnosis not present

## 2021-11-23 MED ORDER — IPRATROPIUM-ALBUTEROL 0.5-2.5 (3) MG/3ML IN SOLN: 3.0000 mL | Freq: Four times a day (QID) | RESPIRATORY_TRACT | 11 refills | Status: AC | PRN

## 2021-11-23 MED ORDER — ANORO ELLIPTA 62.5-25 MCG/ACT IN AEPB: 1.0000 | INHALATION_SPRAY | Freq: Every day | RESPIRATORY_TRACT | 6 refills | Status: AC

## 2021-11-23 NOTE — Patient Instructions (Signed)
-   Referral placed to pulmonary rehab - you will be called to schedule bone density scan - Anoro, nebs refilled  - see you in a year or sooner if need be!

## 2021-11-24 ENCOUNTER — Other Ambulatory Visit: Payer: Self-pay | Admitting: Student

## 2021-11-24 DIAGNOSIS — J449 Chronic obstructive pulmonary disease, unspecified: Secondary | ICD-10-CM

## 2021-11-24 NOTE — Progress Notes (Signed)
Encounter for orders only.

## 2021-11-30 ENCOUNTER — Ambulatory Visit (INDEPENDENT_AMBULATORY_CARE_PROVIDER_SITE_OTHER)
Admission: RE | Admit: 2021-11-30 | Discharge: 2021-11-30 | Disposition: A | Discharge status: Home / Self Care | Payer: Medicare HMO | Source: Ambulatory Visit | Attending: Student | Admitting: Student

## 2021-11-30 DIAGNOSIS — Z7952 Long term (current) use of systemic steroids: Secondary | ICD-10-CM | POA: Diagnosis not present

## 2021-11-30 DIAGNOSIS — J449 Chronic obstructive pulmonary disease, unspecified: Secondary | ICD-10-CM

## 2021-12-05 DIAGNOSIS — Z515 Encounter for palliative care: Secondary | ICD-10-CM | POA: Diagnosis not present

## 2021-12-05 DIAGNOSIS — R54 Age-related physical debility: Secondary | ICD-10-CM | POA: Diagnosis not present

## 2021-12-05 DIAGNOSIS — J9601 Acute respiratory failure with hypoxia: Secondary | ICD-10-CM | POA: Diagnosis not present

## 2021-12-05 DIAGNOSIS — R791 Abnormal coagulation profile: Secondary | ICD-10-CM | POA: Diagnosis not present

## 2021-12-05 DIAGNOSIS — J811 Chronic pulmonary edema: Secondary | ICD-10-CM | POA: Diagnosis not present

## 2021-12-05 DIAGNOSIS — R1312 Dysphagia, oropharyngeal phase: Secondary | ICD-10-CM | POA: Diagnosis not present

## 2021-12-05 DIAGNOSIS — I083 Combined rheumatic disorders of mitral, aortic and tricuspid valves: Secondary | ICD-10-CM | POA: Diagnosis not present

## 2021-12-05 DIAGNOSIS — M7989 Other specified soft tissue disorders: Secondary | ICD-10-CM | POA: Diagnosis not present

## 2021-12-05 DIAGNOSIS — Z66 Do not resuscitate: Secondary | ICD-10-CM | POA: Diagnosis not present

## 2021-12-05 DIAGNOSIS — J9602 Acute respiratory failure with hypercapnia: Secondary | ICD-10-CM | POA: Diagnosis not present

## 2021-12-05 DIAGNOSIS — R579 Shock, unspecified: Secondary | ICD-10-CM | POA: Diagnosis not present

## 2021-12-05 DIAGNOSIS — R0603 Acute respiratory distress: Secondary | ICD-10-CM | POA: Diagnosis not present

## 2021-12-05 DIAGNOSIS — R0989 Other specified symptoms and signs involving the circulatory and respiratory systems: Secondary | ICD-10-CM | POA: Diagnosis not present

## 2021-12-05 DIAGNOSIS — N179 Acute kidney failure, unspecified: Secondary | ICD-10-CM | POA: Diagnosis not present

## 2021-12-05 DIAGNOSIS — R918 Other nonspecific abnormal finding of lung field: Secondary | ICD-10-CM | POA: Diagnosis not present

## 2021-12-05 DIAGNOSIS — J440 Chronic obstructive pulmonary disease with acute lower respiratory infection: Secondary | ICD-10-CM | POA: Diagnosis not present

## 2021-12-05 DIAGNOSIS — I77810 Thoracic aortic ectasia: Secondary | ICD-10-CM | POA: Diagnosis not present

## 2021-12-05 DIAGNOSIS — J441 Chronic obstructive pulmonary disease with (acute) exacerbation: Secondary | ICD-10-CM | POA: Diagnosis not present

## 2021-12-05 DIAGNOSIS — J189 Pneumonia, unspecified organism: Secondary | ICD-10-CM | POA: Diagnosis not present

## 2021-12-05 DIAGNOSIS — J9621 Acute and chronic respiratory failure with hypoxia: Secondary | ICD-10-CM | POA: Diagnosis not present

## 2021-12-05 DIAGNOSIS — R0689 Other abnormalities of breathing: Secondary | ICD-10-CM | POA: Diagnosis not present

## 2021-12-05 DIAGNOSIS — R0602 Shortness of breath: Secondary | ICD-10-CM | POA: Diagnosis not present

## 2021-12-05 DIAGNOSIS — I5041 Acute combined systolic (congestive) and diastolic (congestive) heart failure: Secondary | ICD-10-CM | POA: Diagnosis not present

## 2021-12-05 DIAGNOSIS — R0600 Dyspnea, unspecified: Secondary | ICD-10-CM | POA: Diagnosis not present

## 2021-12-05 DIAGNOSIS — Z4682 Encounter for fitting and adjustment of non-vascular catheter: Secondary | ICD-10-CM | POA: Diagnosis not present

## 2021-12-05 DIAGNOSIS — R6521 Severe sepsis with septic shock: Secondary | ICD-10-CM | POA: Diagnosis not present

## 2021-12-05 DIAGNOSIS — Z452 Encounter for adjustment and management of vascular access device: Secondary | ICD-10-CM | POA: Diagnosis not present

## 2021-12-05 DIAGNOSIS — N1832 Chronic kidney disease, stage 3b: Secondary | ICD-10-CM | POA: Diagnosis not present

## 2021-12-05 DIAGNOSIS — J439 Emphysema, unspecified: Secondary | ICD-10-CM | POA: Diagnosis not present

## 2021-12-05 DIAGNOSIS — J9622 Acute and chronic respiratory failure with hypercapnia: Secondary | ICD-10-CM | POA: Diagnosis not present

## 2021-12-05 DIAGNOSIS — Z20822 Contact with and (suspected) exposure to covid-19: Secondary | ICD-10-CM | POA: Diagnosis not present

## 2021-12-05 DIAGNOSIS — Z7189 Other specified counseling: Secondary | ICD-10-CM | POA: Diagnosis not present

## 2021-12-05 DIAGNOSIS — I959 Hypotension, unspecified: Secondary | ICD-10-CM | POA: Diagnosis not present

## 2021-12-05 DIAGNOSIS — R1310 Dysphagia, unspecified: Secondary | ICD-10-CM | POA: Diagnosis not present

## 2021-12-05 DIAGNOSIS — R000 Tachycardia, unspecified: Secondary | ICD-10-CM | POA: Diagnosis not present

## 2021-12-05 DIAGNOSIS — J9611 Chronic respiratory failure with hypoxia: Secondary | ICD-10-CM | POA: Diagnosis not present

## 2021-12-05 DIAGNOSIS — J984 Other disorders of lung: Secondary | ICD-10-CM | POA: Diagnosis not present

## 2021-12-05 DIAGNOSIS — J90 Pleural effusion, not elsewhere classified: Secondary | ICD-10-CM | POA: Diagnosis not present

## 2021-12-05 DIAGNOSIS — I130 Hypertensive heart and chronic kidney disease with heart failure and stage 1 through stage 4 chronic kidney disease, or unspecified chronic kidney disease: Secondary | ICD-10-CM | POA: Diagnosis not present

## 2021-12-05 DIAGNOSIS — G9341 Metabolic encephalopathy: Secondary | ICD-10-CM | POA: Diagnosis not present

## 2021-12-05 DIAGNOSIS — D72829 Elevated white blood cell count, unspecified: Secondary | ICD-10-CM | POA: Diagnosis not present

## 2021-12-05 DIAGNOSIS — J449 Chronic obstructive pulmonary disease, unspecified: Secondary | ICD-10-CM | POA: Diagnosis not present

## 2021-12-05 DIAGNOSIS — R0902 Hypoxemia: Secondary | ICD-10-CM | POA: Diagnosis not present

## 2021-12-05 DIAGNOSIS — A419 Sepsis, unspecified organism: Secondary | ICD-10-CM | POA: Diagnosis not present

## 2021-12-05 DIAGNOSIS — R778 Other specified abnormalities of plasma proteins: Secondary | ICD-10-CM | POA: Diagnosis not present

## 2021-12-05 DIAGNOSIS — Z743 Need for continuous supervision: Secondary | ICD-10-CM | POA: Diagnosis not present

## 2021-12-05 DIAGNOSIS — I081 Rheumatic disorders of both mitral and tricuspid valves: Secondary | ICD-10-CM | POA: Diagnosis not present

## 2021-12-05 DIAGNOSIS — K5900 Constipation, unspecified: Secondary | ICD-10-CM | POA: Diagnosis not present

## 2021-12-05 DIAGNOSIS — A4189 Other specified sepsis: Secondary | ICD-10-CM | POA: Diagnosis not present

## 2021-12-05 DIAGNOSIS — J159 Unspecified bacterial pneumonia: Secondary | ICD-10-CM | POA: Diagnosis not present

## 2021-12-12 DIAGNOSIS — Z7189 Other specified counseling: Secondary | ICD-10-CM | POA: Diagnosis not present

## 2021-12-12 DIAGNOSIS — J441 Chronic obstructive pulmonary disease with (acute) exacerbation: Secondary | ICD-10-CM | POA: Diagnosis not present

## 2021-12-12 DIAGNOSIS — R0600 Dyspnea, unspecified: Secondary | ICD-10-CM | POA: Diagnosis not present

## 2021-12-12 DIAGNOSIS — R1310 Dysphagia, unspecified: Secondary | ICD-10-CM | POA: Diagnosis not present

## 2021-12-12 DIAGNOSIS — J9601 Acute respiratory failure with hypoxia: Secondary | ICD-10-CM | POA: Diagnosis not present

## 2021-12-12 DIAGNOSIS — G9341 Metabolic encephalopathy: Secondary | ICD-10-CM | POA: Diagnosis not present

## 2021-12-12 DIAGNOSIS — J9602 Acute respiratory failure with hypercapnia: Secondary | ICD-10-CM | POA: Diagnosis not present

## 2021-12-12 DIAGNOSIS — Z515 Encounter for palliative care: Secondary | ICD-10-CM | POA: Diagnosis not present

## 2021-12-16 DIAGNOSIS — Z515 Encounter for palliative care: Secondary | ICD-10-CM | POA: Diagnosis not present

## 2021-12-16 DIAGNOSIS — I428 Other cardiomyopathies: Secondary | ICD-10-CM | POA: Diagnosis not present

## 2021-12-16 DIAGNOSIS — N182 Chronic kidney disease, stage 2 (mild): Secondary | ICD-10-CM | POA: Diagnosis not present

## 2021-12-16 DIAGNOSIS — J439 Emphysema, unspecified: Secondary | ICD-10-CM | POA: Diagnosis not present

## 2021-12-16 DIAGNOSIS — I5022 Chronic systolic (congestive) heart failure: Secondary | ICD-10-CM | POA: Diagnosis not present

## 2021-12-26 DIAGNOSIS — I110 Hypertensive heart disease with heart failure: Secondary | ICD-10-CM | POA: Diagnosis not present

## 2021-12-26 DIAGNOSIS — I739 Peripheral vascular disease, unspecified: Secondary | ICD-10-CM | POA: Diagnosis not present

## 2021-12-26 DIAGNOSIS — E876 Hypokalemia: Secondary | ICD-10-CM | POA: Diagnosis not present

## 2021-12-26 DIAGNOSIS — Z880 Allergy status to penicillin: Secondary | ICD-10-CM | POA: Diagnosis not present

## 2021-12-26 DIAGNOSIS — J439 Emphysema, unspecified: Secondary | ICD-10-CM | POA: Diagnosis not present

## 2021-12-26 DIAGNOSIS — I2510 Atherosclerotic heart disease of native coronary artery without angina pectoris: Secondary | ICD-10-CM | POA: Diagnosis not present

## 2021-12-26 DIAGNOSIS — R32 Unspecified urinary incontinence: Secondary | ICD-10-CM | POA: Diagnosis not present

## 2021-12-26 DIAGNOSIS — I252 Old myocardial infarction: Secondary | ICD-10-CM | POA: Diagnosis not present

## 2021-12-26 DIAGNOSIS — J9611 Chronic respiratory failure with hypoxia: Secondary | ICD-10-CM | POA: Diagnosis not present

## 2021-12-26 DIAGNOSIS — Z87891 Personal history of nicotine dependence: Secondary | ICD-10-CM | POA: Diagnosis not present

## 2021-12-26 DIAGNOSIS — I509 Heart failure, unspecified: Secondary | ICD-10-CM | POA: Diagnosis not present

## 2021-12-26 DIAGNOSIS — E261 Secondary hyperaldosteronism: Secondary | ICD-10-CM | POA: Diagnosis not present

## 2022-01-08 DIAGNOSIS — J449 Chronic obstructive pulmonary disease, unspecified: Secondary | ICD-10-CM | POA: Diagnosis not present

## 2022-01-11 DIAGNOSIS — J9601 Acute respiratory failure with hypoxia: Secondary | ICD-10-CM | POA: Diagnosis not present

## 2022-01-12 DIAGNOSIS — N182 Chronic kidney disease, stage 2 (mild): Secondary | ICD-10-CM | POA: Diagnosis not present

## 2022-01-12 DIAGNOSIS — I5022 Chronic systolic (congestive) heart failure: Secondary | ICD-10-CM | POA: Diagnosis not present

## 2022-01-12 DIAGNOSIS — I428 Other cardiomyopathies: Secondary | ICD-10-CM | POA: Diagnosis not present

## 2022-01-24 ENCOUNTER — Telehealth: Payer: Self-pay | Admitting: *Deleted

## 2022-01-24 DIAGNOSIS — J9611 Chronic respiratory failure with hypoxia: Secondary | ICD-10-CM

## 2022-01-24 NOTE — Telephone Encounter (Signed)
Called daughter in law and she states that patient is no longer using his cpap machine at all and she is wanting to know if they can get an order to discontinue it to get it picked up  Please advise sir

## 2022-01-24 NOTE — Telephone Encounter (Signed)
Daughter in [place] states patient is not using his CPAP machine and needs an order release from doctor for the home health to pick up. Please call [phone]

## 2022-01-24 NOTE — Telephone Encounter (Signed)
I'm ok with that

## 2022-01-24 NOTE — Telephone Encounter (Signed)
Called and spoke with Jeremy Brewer. She is aware that we will place the order to D/C patients bipap. She requested that we send the order to [place].   Order has been placed. Nothing further needed at time of call.

## 2022-01-31 DIAGNOSIS — N182 Chronic kidney disease, stage 2 (mild): Secondary | ICD-10-CM | POA: Diagnosis not present

## 2022-01-31 DIAGNOSIS — J439 Emphysema, unspecified: Secondary | ICD-10-CM | POA: Diagnosis not present

## 2022-01-31 DIAGNOSIS — I428 Other cardiomyopathies: Secondary | ICD-10-CM | POA: Diagnosis not present

## 2022-01-31 DIAGNOSIS — I5022 Chronic systolic (congestive) heart failure: Secondary | ICD-10-CM | POA: Diagnosis not present

## 2022-02-08 DIAGNOSIS — J449 Chronic obstructive pulmonary disease, unspecified: Secondary | ICD-10-CM | POA: Diagnosis not present

## 2022-03-09 DIAGNOSIS — J9611 Chronic respiratory failure with hypoxia: Secondary | ICD-10-CM | POA: Diagnosis not present

## 2022-03-09 DIAGNOSIS — J449 Chronic obstructive pulmonary disease, unspecified: Secondary | ICD-10-CM | POA: Diagnosis not present

## 2022-03-10 DIAGNOSIS — J449 Chronic obstructive pulmonary disease, unspecified: Secondary | ICD-10-CM | POA: Diagnosis not present

## 2022-04-05 DIAGNOSIS — N1832 Chronic kidney disease, stage 3b: Secondary | ICD-10-CM | POA: Diagnosis not present

## 2022-04-05 DIAGNOSIS — I5022 Chronic systolic (congestive) heart failure: Secondary | ICD-10-CM | POA: Diagnosis not present

## 2022-04-05 DIAGNOSIS — J439 Emphysema, unspecified: Secondary | ICD-10-CM | POA: Diagnosis not present

## 2022-04-05 DIAGNOSIS — I428 Other cardiomyopathies: Secondary | ICD-10-CM | POA: Diagnosis not present

## 2022-04-09 DIAGNOSIS — J9611 Chronic respiratory failure with hypoxia: Secondary | ICD-10-CM | POA: Diagnosis not present

## 2022-04-09 DIAGNOSIS — J449 Chronic obstructive pulmonary disease, unspecified: Secondary | ICD-10-CM | POA: Diagnosis not present

## 2022-04-10 DIAGNOSIS — J449 Chronic obstructive pulmonary disease, unspecified: Secondary | ICD-10-CM | POA: Diagnosis not present

## 2022-04-11 DIAGNOSIS — N184 Chronic kidney disease, stage 4 (severe): Secondary | ICD-10-CM | POA: Diagnosis not present

## 2022-04-11 DIAGNOSIS — I429 Cardiomyopathy, unspecified: Secondary | ICD-10-CM | POA: Diagnosis not present

## 2022-04-11 DIAGNOSIS — I2510 Atherosclerotic heart disease of native coronary artery without angina pectoris: Secondary | ICD-10-CM | POA: Diagnosis not present

## 2022-04-11 DIAGNOSIS — N1832 Chronic kidney disease, stage 3b: Secondary | ICD-10-CM | POA: Diagnosis not present

## 2022-04-11 DIAGNOSIS — I5022 Chronic systolic (congestive) heart failure: Secondary | ICD-10-CM | POA: Diagnosis not present

## 2022-05-09 DIAGNOSIS — Z0000 Encounter for general adult medical examination without abnormal findings: Secondary | ICD-10-CM | POA: Diagnosis not present

## 2022-05-09 DIAGNOSIS — N184 Chronic kidney disease, stage 4 (severe): Secondary | ICD-10-CM | POA: Diagnosis not present

## 2022-05-10 DIAGNOSIS — J9611 Chronic respiratory failure with hypoxia: Secondary | ICD-10-CM | POA: Diagnosis not present

## 2022-05-10 DIAGNOSIS — J449 Chronic obstructive pulmonary disease, unspecified: Secondary | ICD-10-CM | POA: Diagnosis not present

## 2022-05-11 DIAGNOSIS — J449 Chronic obstructive pulmonary disease, unspecified: Secondary | ICD-10-CM | POA: Diagnosis not present

## 2022-05-25 DIAGNOSIS — N179 Acute kidney failure, unspecified: Secondary | ICD-10-CM | POA: Diagnosis not present

## 2022-05-25 DIAGNOSIS — N400 Enlarged prostate without lower urinary tract symptoms: Secondary | ICD-10-CM | POA: Diagnosis not present

## 2022-05-25 DIAGNOSIS — I2510 Atherosclerotic heart disease of native coronary artery without angina pectoris: Secondary | ICD-10-CM | POA: Diagnosis not present

## 2022-05-25 DIAGNOSIS — I7143 Infrarenal abdominal aortic aneurysm, without rupture: Secondary | ICD-10-CM | POA: Diagnosis not present

## 2022-05-25 DIAGNOSIS — N200 Calculus of kidney: Secondary | ICD-10-CM | POA: Diagnosis not present

## 2022-05-25 DIAGNOSIS — J439 Emphysema, unspecified: Secondary | ICD-10-CM | POA: Diagnosis not present

## 2022-05-25 DIAGNOSIS — G9341 Metabolic encephalopathy: Secondary | ICD-10-CM | POA: Diagnosis not present

## 2022-05-25 DIAGNOSIS — Z8709 Personal history of other diseases of the respiratory system: Secondary | ICD-10-CM | POA: Diagnosis not present

## 2022-05-25 DIAGNOSIS — I214 Non-ST elevation (NSTEMI) myocardial infarction: Secondary | ICD-10-CM | POA: Diagnosis not present

## 2022-05-25 DIAGNOSIS — R4182 Altered mental status, unspecified: Secondary | ICD-10-CM | POA: Diagnosis not present

## 2022-05-25 DIAGNOSIS — J479 Bronchiectasis, uncomplicated: Secondary | ICD-10-CM | POA: Diagnosis not present

## 2022-05-25 DIAGNOSIS — R1900 Intra-abdominal and pelvic swelling, mass and lump, unspecified site: Secondary | ICD-10-CM | POA: Diagnosis not present

## 2022-05-25 DIAGNOSIS — J3489 Other specified disorders of nose and nasal sinuses: Secondary | ICD-10-CM | POA: Diagnosis not present

## 2022-05-25 DIAGNOSIS — D649 Anemia, unspecified: Secondary | ICD-10-CM | POA: Diagnosis not present

## 2022-05-25 DIAGNOSIS — R6511 Systemic inflammatory response syndrome (SIRS) of non-infectious origin with acute organ dysfunction: Secondary | ICD-10-CM | POA: Diagnosis not present

## 2022-05-25 DIAGNOSIS — R918 Other nonspecific abnormal finding of lung field: Secondary | ICD-10-CM | POA: Diagnosis not present

## 2022-05-25 DIAGNOSIS — D72829 Elevated white blood cell count, unspecified: Secondary | ICD-10-CM | POA: Diagnosis not present

## 2022-05-25 DIAGNOSIS — N281 Cyst of kidney, acquired: Secondary | ICD-10-CM | POA: Diagnosis not present

## 2022-05-25 DIAGNOSIS — Z8679 Personal history of other diseases of the circulatory system: Secondary | ICD-10-CM | POA: Diagnosis not present

## 2022-05-25 DIAGNOSIS — R6510 Systemic inflammatory response syndrome (SIRS) of non-infectious origin without acute organ dysfunction: Secondary | ICD-10-CM | POA: Diagnosis not present

## 2022-05-25 DIAGNOSIS — J189 Pneumonia, unspecified organism: Secondary | ICD-10-CM | POA: Diagnosis not present

## 2022-05-25 DIAGNOSIS — J9611 Chronic respiratory failure with hypoxia: Secondary | ICD-10-CM | POA: Diagnosis not present

## 2022-05-25 DIAGNOSIS — R0989 Other specified symptoms and signs involving the circulatory and respiratory systems: Secondary | ICD-10-CM | POA: Diagnosis not present

## 2022-05-25 DIAGNOSIS — J9621 Acute and chronic respiratory failure with hypoxia: Secondary | ICD-10-CM | POA: Diagnosis not present

## 2022-05-25 DIAGNOSIS — S32010A Wedge compression fracture of first lumbar vertebra, initial encounter for closed fracture: Secondary | ICD-10-CM | POA: Diagnosis not present

## 2022-05-25 DIAGNOSIS — I959 Hypotension, unspecified: Secondary | ICD-10-CM | POA: Diagnosis not present

## 2022-05-26 DIAGNOSIS — I517 Cardiomegaly: Secondary | ICD-10-CM | POA: Diagnosis not present

## 2022-05-26 DIAGNOSIS — D62 Acute posthemorrhagic anemia: Secondary | ICD-10-CM | POA: Diagnosis not present

## 2022-05-26 DIAGNOSIS — I9589 Other hypotension: Secondary | ICD-10-CM | POA: Diagnosis not present

## 2022-05-26 DIAGNOSIS — Z8679 Personal history of other diseases of the circulatory system: Secondary | ICD-10-CM | POA: Diagnosis not present

## 2022-05-26 DIAGNOSIS — I7143 Infrarenal abdominal aortic aneurysm, without rupture: Secondary | ICD-10-CM | POA: Diagnosis not present

## 2022-05-26 DIAGNOSIS — G9341 Metabolic encephalopathy: Secondary | ICD-10-CM | POA: Diagnosis not present

## 2022-05-26 DIAGNOSIS — I77810 Thoracic aortic ectasia: Secondary | ICD-10-CM | POA: Diagnosis not present

## 2022-05-26 DIAGNOSIS — S32010A Wedge compression fracture of first lumbar vertebra, initial encounter for closed fracture: Secondary | ICD-10-CM | POA: Diagnosis not present

## 2022-05-26 DIAGNOSIS — D72828 Other elevated white blood cell count: Secondary | ICD-10-CM | POA: Diagnosis not present

## 2022-05-26 DIAGNOSIS — N200 Calculus of kidney: Secondary | ICD-10-CM | POA: Diagnosis not present

## 2022-05-26 DIAGNOSIS — R1900 Intra-abdominal and pelvic swelling, mass and lump, unspecified site: Secondary | ICD-10-CM | POA: Diagnosis not present

## 2022-05-26 DIAGNOSIS — J439 Emphysema, unspecified: Secondary | ICD-10-CM | POA: Diagnosis not present

## 2022-05-26 DIAGNOSIS — I2510 Atherosclerotic heart disease of native coronary artery without angina pectoris: Secondary | ICD-10-CM | POA: Diagnosis not present

## 2022-05-26 DIAGNOSIS — E871 Hypo-osmolality and hyponatremia: Secondary | ICD-10-CM | POA: Diagnosis not present

## 2022-05-26 DIAGNOSIS — I440 Atrioventricular block, first degree: Secondary | ICD-10-CM | POA: Diagnosis not present

## 2022-05-26 DIAGNOSIS — R6511 Systemic inflammatory response syndrome (SIRS) of non-infectious origin with acute organ dysfunction: Secondary | ICD-10-CM | POA: Diagnosis not present

## 2022-05-26 DIAGNOSIS — R410 Disorientation, unspecified: Secondary | ICD-10-CM | POA: Diagnosis not present

## 2022-05-26 DIAGNOSIS — N281 Cyst of kidney, acquired: Secondary | ICD-10-CM | POA: Diagnosis not present

## 2022-05-26 DIAGNOSIS — M7989 Other specified soft tissue disorders: Secondary | ICD-10-CM | POA: Diagnosis not present

## 2022-05-26 DIAGNOSIS — N184 Chronic kidney disease, stage 4 (severe): Secondary | ICD-10-CM | POA: Diagnosis not present

## 2022-05-26 DIAGNOSIS — R0989 Other specified symptoms and signs involving the circulatory and respiratory systems: Secondary | ICD-10-CM | POA: Diagnosis not present

## 2022-05-26 DIAGNOSIS — E785 Hyperlipidemia, unspecified: Secondary | ICD-10-CM | POA: Diagnosis not present

## 2022-05-26 DIAGNOSIS — R6510 Systemic inflammatory response syndrome (SIRS) of non-infectious origin without acute organ dysfunction: Secondary | ICD-10-CM | POA: Diagnosis not present

## 2022-05-26 DIAGNOSIS — M4856XA Collapsed vertebra, not elsewhere classified, lumbar region, initial encounter for fracture: Secondary | ICD-10-CM | POA: Diagnosis not present

## 2022-05-26 DIAGNOSIS — D649 Anemia, unspecified: Secondary | ICD-10-CM | POA: Diagnosis not present

## 2022-05-26 DIAGNOSIS — N179 Acute kidney failure, unspecified: Secondary | ICD-10-CM | POA: Diagnosis not present

## 2022-05-26 DIAGNOSIS — J449 Chronic obstructive pulmonary disease, unspecified: Secondary | ICD-10-CM | POA: Diagnosis not present

## 2022-05-26 DIAGNOSIS — I429 Cardiomyopathy, unspecified: Secondary | ICD-10-CM | POA: Diagnosis not present

## 2022-05-26 DIAGNOSIS — J189 Pneumonia, unspecified organism: Secondary | ICD-10-CM | POA: Diagnosis not present

## 2022-05-26 DIAGNOSIS — I21A1 Myocardial infarction type 2: Secondary | ICD-10-CM | POA: Diagnosis not present

## 2022-05-26 DIAGNOSIS — Z8709 Personal history of other diseases of the respiratory system: Secondary | ICD-10-CM | POA: Diagnosis not present

## 2022-05-26 DIAGNOSIS — I509 Heart failure, unspecified: Secondary | ICD-10-CM | POA: Diagnosis not present

## 2022-05-26 DIAGNOSIS — N400 Enlarged prostate without lower urinary tract symptoms: Secondary | ICD-10-CM | POA: Diagnosis not present

## 2022-05-26 DIAGNOSIS — J479 Bronchiectasis, uncomplicated: Secondary | ICD-10-CM | POA: Diagnosis not present

## 2022-05-26 DIAGNOSIS — R918 Other nonspecific abnormal finding of lung field: Secondary | ICD-10-CM | POA: Diagnosis not present

## 2022-05-26 DIAGNOSIS — R4182 Altered mental status, unspecified: Secondary | ICD-10-CM | POA: Diagnosis not present

## 2022-05-26 DIAGNOSIS — D631 Anemia in chronic kidney disease: Secondary | ICD-10-CM | POA: Diagnosis not present

## 2022-05-26 DIAGNOSIS — R441 Visual hallucinations: Secondary | ICD-10-CM | POA: Diagnosis not present

## 2022-05-26 DIAGNOSIS — I5022 Chronic systolic (congestive) heart failure: Secondary | ICD-10-CM | POA: Diagnosis not present

## 2022-05-26 DIAGNOSIS — I959 Hypotension, unspecified: Secondary | ICD-10-CM | POA: Diagnosis not present

## 2022-05-26 DIAGNOSIS — I214 Non-ST elevation (NSTEMI) myocardial infarction: Secondary | ICD-10-CM | POA: Diagnosis not present

## 2022-05-26 DIAGNOSIS — J3489 Other specified disorders of nose and nasal sinuses: Secondary | ICD-10-CM | POA: Diagnosis not present

## 2022-05-26 DIAGNOSIS — J9611 Chronic respiratory failure with hypoxia: Secondary | ICD-10-CM | POA: Diagnosis not present

## 2022-05-26 DIAGNOSIS — J9621 Acute and chronic respiratory failure with hypoxia: Secondary | ICD-10-CM | POA: Diagnosis not present

## 2022-05-26 DIAGNOSIS — R443 Hallucinations, unspecified: Secondary | ICD-10-CM | POA: Diagnosis not present

## 2022-05-26 DIAGNOSIS — I428 Other cardiomyopathies: Secondary | ICD-10-CM | POA: Diagnosis not present

## 2022-05-27 DIAGNOSIS — N281 Cyst of kidney, acquired: Secondary | ICD-10-CM | POA: Diagnosis not present

## 2022-05-27 DIAGNOSIS — I509 Heart failure, unspecified: Secondary | ICD-10-CM | POA: Diagnosis not present

## 2022-05-27 DIAGNOSIS — Z8709 Personal history of other diseases of the respiratory system: Secondary | ICD-10-CM | POA: Diagnosis not present

## 2022-05-27 DIAGNOSIS — R6510 Systemic inflammatory response syndrome (SIRS) of non-infectious origin without acute organ dysfunction: Secondary | ICD-10-CM | POA: Diagnosis not present

## 2022-05-27 DIAGNOSIS — S32010A Wedge compression fracture of first lumbar vertebra, initial encounter for closed fracture: Secondary | ICD-10-CM | POA: Diagnosis not present

## 2022-05-27 DIAGNOSIS — I77810 Thoracic aortic ectasia: Secondary | ICD-10-CM | POA: Diagnosis not present

## 2022-05-27 DIAGNOSIS — N179 Acute kidney failure, unspecified: Secondary | ICD-10-CM | POA: Diagnosis not present

## 2022-05-27 DIAGNOSIS — I5022 Chronic systolic (congestive) heart failure: Secondary | ICD-10-CM | POA: Diagnosis not present

## 2022-05-27 DIAGNOSIS — I517 Cardiomegaly: Secondary | ICD-10-CM | POA: Diagnosis not present

## 2022-05-27 DIAGNOSIS — J9611 Chronic respiratory failure with hypoxia: Secondary | ICD-10-CM | POA: Diagnosis not present

## 2022-05-27 DIAGNOSIS — G9341 Metabolic encephalopathy: Secondary | ICD-10-CM | POA: Diagnosis not present

## 2022-05-27 DIAGNOSIS — R410 Disorientation, unspecified: Secondary | ICD-10-CM | POA: Diagnosis not present

## 2022-05-27 DIAGNOSIS — D649 Anemia, unspecified: Secondary | ICD-10-CM | POA: Diagnosis not present

## 2022-05-27 DIAGNOSIS — I214 Non-ST elevation (NSTEMI) myocardial infarction: Secondary | ICD-10-CM | POA: Diagnosis not present

## 2022-05-27 DIAGNOSIS — D62 Acute posthemorrhagic anemia: Secondary | ICD-10-CM | POA: Diagnosis not present

## 2022-05-27 DIAGNOSIS — I7143 Infrarenal abdominal aortic aneurysm, without rupture: Secondary | ICD-10-CM | POA: Diagnosis not present

## 2022-05-27 DIAGNOSIS — I429 Cardiomyopathy, unspecified: Secondary | ICD-10-CM | POA: Diagnosis not present

## 2022-05-28 DIAGNOSIS — Z8709 Personal history of other diseases of the respiratory system: Secondary | ICD-10-CM | POA: Diagnosis not present

## 2022-05-28 DIAGNOSIS — N281 Cyst of kidney, acquired: Secondary | ICD-10-CM | POA: Diagnosis not present

## 2022-05-28 DIAGNOSIS — I214 Non-ST elevation (NSTEMI) myocardial infarction: Secondary | ICD-10-CM | POA: Diagnosis not present

## 2022-05-28 DIAGNOSIS — M7989 Other specified soft tissue disorders: Secondary | ICD-10-CM | POA: Diagnosis not present

## 2022-05-28 DIAGNOSIS — I5022 Chronic systolic (congestive) heart failure: Secondary | ICD-10-CM | POA: Diagnosis not present

## 2022-05-28 DIAGNOSIS — G9341 Metabolic encephalopathy: Secondary | ICD-10-CM | POA: Diagnosis not present

## 2022-05-28 DIAGNOSIS — S32010A Wedge compression fracture of first lumbar vertebra, initial encounter for closed fracture: Secondary | ICD-10-CM | POA: Diagnosis not present

## 2022-05-28 DIAGNOSIS — J9611 Chronic respiratory failure with hypoxia: Secondary | ICD-10-CM | POA: Diagnosis not present

## 2022-05-28 DIAGNOSIS — D62 Acute posthemorrhagic anemia: Secondary | ICD-10-CM | POA: Diagnosis not present

## 2022-05-28 DIAGNOSIS — D649 Anemia, unspecified: Secondary | ICD-10-CM | POA: Diagnosis not present

## 2022-05-28 DIAGNOSIS — I7143 Infrarenal abdominal aortic aneurysm, without rupture: Secondary | ICD-10-CM | POA: Diagnosis not present

## 2022-05-28 DIAGNOSIS — R410 Disorientation, unspecified: Secondary | ICD-10-CM | POA: Diagnosis not present

## 2022-05-28 DIAGNOSIS — N179 Acute kidney failure, unspecified: Secondary | ICD-10-CM | POA: Diagnosis not present

## 2022-05-29 DIAGNOSIS — R6510 Systemic inflammatory response syndrome (SIRS) of non-infectious origin without acute organ dysfunction: Secondary | ICD-10-CM | POA: Diagnosis not present

## 2022-05-29 DIAGNOSIS — I21A1 Myocardial infarction type 2: Secondary | ICD-10-CM | POA: Diagnosis not present

## 2022-05-29 DIAGNOSIS — Z8709 Personal history of other diseases of the respiratory system: Secondary | ICD-10-CM | POA: Diagnosis not present

## 2022-05-29 DIAGNOSIS — N189 Chronic kidney disease, unspecified: Secondary | ICD-10-CM | POA: Diagnosis not present

## 2022-05-29 DIAGNOSIS — I7143 Infrarenal abdominal aortic aneurysm, without rupture: Secondary | ICD-10-CM | POA: Diagnosis not present

## 2022-05-29 DIAGNOSIS — S32010A Wedge compression fracture of first lumbar vertebra, initial encounter for closed fracture: Secondary | ICD-10-CM | POA: Diagnosis not present

## 2022-05-29 DIAGNOSIS — I5022 Chronic systolic (congestive) heart failure: Secondary | ICD-10-CM | POA: Diagnosis not present

## 2022-05-29 DIAGNOSIS — I959 Hypotension, unspecified: Secondary | ICD-10-CM | POA: Diagnosis not present

## 2022-05-29 DIAGNOSIS — G9341 Metabolic encephalopathy: Secondary | ICD-10-CM | POA: Diagnosis not present

## 2022-05-29 DIAGNOSIS — N281 Cyst of kidney, acquired: Secondary | ICD-10-CM | POA: Diagnosis not present

## 2022-05-29 DIAGNOSIS — J9611 Chronic respiratory failure with hypoxia: Secondary | ICD-10-CM | POA: Diagnosis not present

## 2022-05-29 DIAGNOSIS — R410 Disorientation, unspecified: Secondary | ICD-10-CM | POA: Diagnosis not present

## 2022-05-29 DIAGNOSIS — N179 Acute kidney failure, unspecified: Secondary | ICD-10-CM | POA: Diagnosis not present

## 2022-05-29 DIAGNOSIS — Z8679 Personal history of other diseases of the circulatory system: Secondary | ICD-10-CM | POA: Diagnosis not present

## 2022-05-29 DIAGNOSIS — D62 Acute posthemorrhagic anemia: Secondary | ICD-10-CM | POA: Diagnosis not present

## 2022-05-29 DIAGNOSIS — D649 Anemia, unspecified: Secondary | ICD-10-CM | POA: Diagnosis not present

## 2022-05-29 DIAGNOSIS — I214 Non-ST elevation (NSTEMI) myocardial infarction: Secondary | ICD-10-CM | POA: Diagnosis not present

## 2022-05-30 DIAGNOSIS — I2510 Atherosclerotic heart disease of native coronary artery without angina pectoris: Secondary | ICD-10-CM | POA: Diagnosis not present

## 2022-05-30 DIAGNOSIS — G9341 Metabolic encephalopathy: Secondary | ICD-10-CM | POA: Diagnosis not present

## 2022-05-30 DIAGNOSIS — Z9981 Dependence on supplemental oxygen: Secondary | ICD-10-CM | POA: Diagnosis not present

## 2022-05-30 DIAGNOSIS — N184 Chronic kidney disease, stage 4 (severe): Secondary | ICD-10-CM | POA: Diagnosis not present

## 2022-05-30 DIAGNOSIS — I5020 Unspecified systolic (congestive) heart failure: Secondary | ICD-10-CM | POA: Diagnosis not present

## 2022-05-30 DIAGNOSIS — I214 Non-ST elevation (NSTEMI) myocardial infarction: Secondary | ICD-10-CM | POA: Diagnosis not present

## 2022-05-30 DIAGNOSIS — D631 Anemia in chronic kidney disease: Secondary | ICD-10-CM | POA: Diagnosis not present

## 2022-05-30 DIAGNOSIS — Z9181 History of falling: Secondary | ICD-10-CM | POA: Diagnosis not present

## 2022-05-30 DIAGNOSIS — J9611 Chronic respiratory failure with hypoxia: Secondary | ICD-10-CM | POA: Diagnosis not present

## 2022-05-30 DIAGNOSIS — D72829 Elevated white blood cell count, unspecified: Secondary | ICD-10-CM | POA: Diagnosis not present

## 2022-05-30 DIAGNOSIS — M4856XD Collapsed vertebra, not elsewhere classified, lumbar region, subsequent encounter for fracture with routine healing: Secondary | ICD-10-CM | POA: Diagnosis not present

## 2022-05-30 DIAGNOSIS — Z7982 Long term (current) use of aspirin: Secondary | ICD-10-CM | POA: Diagnosis not present

## 2022-05-30 DIAGNOSIS — N281 Cyst of kidney, acquired: Secondary | ICD-10-CM | POA: Diagnosis not present

## 2022-05-30 DIAGNOSIS — R69 Illness, unspecified: Secondary | ICD-10-CM | POA: Diagnosis not present

## 2022-05-30 DIAGNOSIS — I130 Hypertensive heart and chronic kidney disease with heart failure and stage 1 through stage 4 chronic kidney disease, or unspecified chronic kidney disease: Secondary | ICD-10-CM | POA: Diagnosis not present

## 2022-05-30 DIAGNOSIS — I7143 Infrarenal abdominal aortic aneurysm, without rupture: Secondary | ICD-10-CM | POA: Diagnosis not present

## 2022-05-30 DIAGNOSIS — I959 Hypotension, unspecified: Secondary | ICD-10-CM | POA: Diagnosis not present

## 2022-05-30 DIAGNOSIS — J449 Chronic obstructive pulmonary disease, unspecified: Secondary | ICD-10-CM | POA: Diagnosis not present

## 2022-05-30 DIAGNOSIS — N179 Acute kidney failure, unspecified: Secondary | ICD-10-CM | POA: Diagnosis not present

## 2022-06-02 DIAGNOSIS — I771 Stricture of artery: Secondary | ICD-10-CM | POA: Diagnosis not present

## 2022-06-07 DIAGNOSIS — N1832 Chronic kidney disease, stage 3b: Secondary | ICD-10-CM | POA: Diagnosis not present

## 2022-06-07 DIAGNOSIS — I771 Stricture of artery: Secondary | ICD-10-CM | POA: Diagnosis not present

## 2022-06-07 DIAGNOSIS — J439 Emphysema, unspecified: Secondary | ICD-10-CM | POA: Diagnosis not present

## 2022-06-07 DIAGNOSIS — I7143 Infrarenal abdominal aortic aneurysm, without rupture: Secondary | ICD-10-CM | POA: Diagnosis not present

## 2022-06-07 DIAGNOSIS — I5020 Unspecified systolic (congestive) heart failure: Secondary | ICD-10-CM | POA: Diagnosis not present

## 2022-06-07 DIAGNOSIS — I428 Other cardiomyopathies: Secondary | ICD-10-CM | POA: Diagnosis not present

## 2022-06-07 DIAGNOSIS — I5023 Acute on chronic systolic (congestive) heart failure: Secondary | ICD-10-CM | POA: Diagnosis not present

## 2022-06-08 DIAGNOSIS — I5022 Chronic systolic (congestive) heart failure: Secondary | ICD-10-CM | POA: Diagnosis not present

## 2022-06-08 DIAGNOSIS — J449 Chronic obstructive pulmonary disease, unspecified: Secondary | ICD-10-CM | POA: Diagnosis not present

## 2022-06-08 DIAGNOSIS — G458 Other transient cerebral ischemic attacks and related syndromes: Secondary | ICD-10-CM | POA: Diagnosis not present

## 2022-06-08 DIAGNOSIS — R6510 Systemic inflammatory response syndrome (SIRS) of non-infectious origin without acute organ dysfunction: Secondary | ICD-10-CM | POA: Diagnosis not present

## 2022-06-08 DIAGNOSIS — N179 Acute kidney failure, unspecified: Secondary | ICD-10-CM | POA: Diagnosis not present

## 2022-06-08 DIAGNOSIS — J9611 Chronic respiratory failure with hypoxia: Secondary | ICD-10-CM | POA: Diagnosis not present

## 2022-06-09 DIAGNOSIS — J449 Chronic obstructive pulmonary disease, unspecified: Secondary | ICD-10-CM | POA: Diagnosis not present

## 2022-06-13 DIAGNOSIS — D649 Anemia, unspecified: Secondary | ICD-10-CM | POA: Diagnosis not present

## 2022-06-13 DIAGNOSIS — D72829 Elevated white blood cell count, unspecified: Secondary | ICD-10-CM | POA: Diagnosis not present

## 2022-06-13 DIAGNOSIS — D759 Disease of blood and blood-forming organs, unspecified: Secondary | ICD-10-CM | POA: Diagnosis not present

## 2022-06-13 DIAGNOSIS — D539 Nutritional anemia, unspecified: Secondary | ICD-10-CM | POA: Diagnosis not present

## 2022-06-30 DIAGNOSIS — D509 Iron deficiency anemia, unspecified: Secondary | ICD-10-CM | POA: Diagnosis not present

## 2022-07-09 DIAGNOSIS — J449 Chronic obstructive pulmonary disease, unspecified: Secondary | ICD-10-CM | POA: Diagnosis not present

## 2022-07-09 DIAGNOSIS — J9611 Chronic respiratory failure with hypoxia: Secondary | ICD-10-CM | POA: Diagnosis not present

## 2022-07-10 DIAGNOSIS — J449 Chronic obstructive pulmonary disease, unspecified: Secondary | ICD-10-CM | POA: Diagnosis not present

## 2022-07-11 DIAGNOSIS — I7143 Infrarenal abdominal aortic aneurysm, without rupture: Secondary | ICD-10-CM | POA: Diagnosis not present

## 2022-07-11 DIAGNOSIS — I771 Stricture of artery: Secondary | ICD-10-CM | POA: Diagnosis not present

## 2022-07-11 DIAGNOSIS — I5020 Unspecified systolic (congestive) heart failure: Secondary | ICD-10-CM | POA: Diagnosis not present

## 2022-07-11 DIAGNOSIS — I5022 Chronic systolic (congestive) heart failure: Secondary | ICD-10-CM | POA: Diagnosis not present

## 2022-07-11 DIAGNOSIS — N1832 Chronic kidney disease, stage 3b: Secondary | ICD-10-CM | POA: Diagnosis not present

## 2022-07-11 DIAGNOSIS — I428 Other cardiomyopathies: Secondary | ICD-10-CM | POA: Diagnosis not present

## 2022-07-13 DIAGNOSIS — W19XXXA Unspecified fall, initial encounter: Secondary | ICD-10-CM | POA: Diagnosis not present

## 2022-07-13 DIAGNOSIS — D509 Iron deficiency anemia, unspecified: Secondary | ICD-10-CM | POA: Diagnosis not present

## 2022-07-13 DIAGNOSIS — J9611 Chronic respiratory failure with hypoxia: Secondary | ICD-10-CM | POA: Diagnosis not present

## 2022-07-13 DIAGNOSIS — G458 Other transient cerebral ischemic attacks and related syndromes: Secondary | ICD-10-CM | POA: Diagnosis not present

## 2022-07-13 DIAGNOSIS — I5022 Chronic systolic (congestive) heart failure: Secondary | ICD-10-CM | POA: Diagnosis not present

## 2022-07-26 DIAGNOSIS — H2513 Age-related nuclear cataract, bilateral: Secondary | ICD-10-CM | POA: Diagnosis not present

## 2022-07-26 DIAGNOSIS — H25043 Posterior subcapsular polar age-related cataract, bilateral: Secondary | ICD-10-CM | POA: Diagnosis not present

## 2022-07-26 DIAGNOSIS — H268 Other specified cataract: Secondary | ICD-10-CM | POA: Diagnosis not present

## 2022-07-26 DIAGNOSIS — H5203 Hypermetropia, bilateral: Secondary | ICD-10-CM | POA: Diagnosis not present

## 2022-07-26 DIAGNOSIS — H02831 Dermatochalasis of right upper eyelid: Secondary | ICD-10-CM | POA: Diagnosis not present

## 2022-07-26 DIAGNOSIS — H3554 Dystrophies primarily involving the retinal pigment epithelium: Secondary | ICD-10-CM | POA: Diagnosis not present

## 2022-07-26 DIAGNOSIS — H02834 Dermatochalasis of left upper eyelid: Secondary | ICD-10-CM | POA: Diagnosis not present

## 2022-07-26 DIAGNOSIS — H43393 Other vitreous opacities, bilateral: Secondary | ICD-10-CM | POA: Diagnosis not present

## 2022-07-26 DIAGNOSIS — H52203 Unspecified astigmatism, bilateral: Secondary | ICD-10-CM | POA: Diagnosis not present

## 2022-07-26 DIAGNOSIS — H524 Presbyopia: Secondary | ICD-10-CM | POA: Diagnosis not present

## 2022-08-08 DIAGNOSIS — J9611 Chronic respiratory failure with hypoxia: Secondary | ICD-10-CM | POA: Diagnosis not present

## 2022-08-08 DIAGNOSIS — J449 Chronic obstructive pulmonary disease, unspecified: Secondary | ICD-10-CM | POA: Diagnosis not present

## 2022-08-09 DIAGNOSIS — J449 Chronic obstructive pulmonary disease, unspecified: Secondary | ICD-10-CM | POA: Diagnosis not present

## 2022-08-29 DIAGNOSIS — H25813 Combined forms of age-related cataract, bilateral: Secondary | ICD-10-CM | POA: Diagnosis not present

## 2022-08-29 DIAGNOSIS — H268 Other specified cataract: Secondary | ICD-10-CM | POA: Diagnosis not present

## 2022-09-08 DIAGNOSIS — J449 Chronic obstructive pulmonary disease, unspecified: Secondary | ICD-10-CM | POA: Diagnosis not present

## 2022-09-08 DIAGNOSIS — J9611 Chronic respiratory failure with hypoxia: Secondary | ICD-10-CM | POA: Diagnosis not present

## 2022-09-09 DIAGNOSIS — J449 Chronic obstructive pulmonary disease, unspecified: Secondary | ICD-10-CM | POA: Diagnosis not present

## 2022-09-22 DIAGNOSIS — N1832 Chronic kidney disease, stage 3b: Secondary | ICD-10-CM | POA: Diagnosis not present

## 2022-09-22 DIAGNOSIS — H25813 Combined forms of age-related cataract, bilateral: Secondary | ICD-10-CM | POA: Diagnosis not present

## 2022-09-22 DIAGNOSIS — D751 Secondary polycythemia: Secondary | ICD-10-CM | POA: Diagnosis not present

## 2022-10-02 DIAGNOSIS — N1832 Chronic kidney disease, stage 3b: Secondary | ICD-10-CM | POA: Diagnosis not present

## 2022-10-02 DIAGNOSIS — I5022 Chronic systolic (congestive) heart failure: Secondary | ICD-10-CM | POA: Diagnosis not present

## 2022-10-04 DIAGNOSIS — I428 Other cardiomyopathies: Secondary | ICD-10-CM | POA: Diagnosis not present

## 2022-10-04 DIAGNOSIS — I7143 Infrarenal abdominal aortic aneurysm, without rupture: Secondary | ICD-10-CM | POA: Diagnosis not present

## 2022-10-04 DIAGNOSIS — N1832 Chronic kidney disease, stage 3b: Secondary | ICD-10-CM | POA: Diagnosis not present

## 2022-10-04 DIAGNOSIS — E875 Hyperkalemia: Secondary | ICD-10-CM | POA: Diagnosis not present

## 2022-10-04 DIAGNOSIS — J439 Emphysema, unspecified: Secondary | ICD-10-CM | POA: Diagnosis not present

## 2022-10-04 DIAGNOSIS — I771 Stricture of artery: Secondary | ICD-10-CM | POA: Diagnosis not present

## 2022-10-04 DIAGNOSIS — I5022 Chronic systolic (congestive) heart failure: Secondary | ICD-10-CM | POA: Diagnosis not present

## 2022-10-08 ENCOUNTER — Telehealth: Payer: Self-pay | Admitting: Student

## 2022-10-08 DIAGNOSIS — M810 Age-related osteoporosis without current pathological fracture: Secondary | ICD-10-CM

## 2022-10-08 DIAGNOSIS — J9611 Chronic respiratory failure with hypoxia: Secondary | ICD-10-CM | POA: Diagnosis not present

## 2022-10-08 DIAGNOSIS — J449 Chronic obstructive pulmonary disease, unspecified: Secondary | ICD-10-CM | POA: Diagnosis not present

## 2022-10-08 NOTE — Telephone Encounter (Signed)
Referral placed to endocrine for osteoporosis

## 2022-10-09 DIAGNOSIS — J449 Chronic obstructive pulmonary disease, unspecified: Secondary | ICD-10-CM | POA: Diagnosis not present

## 2022-10-17 DIAGNOSIS — J449 Chronic obstructive pulmonary disease, unspecified: Secondary | ICD-10-CM | POA: Diagnosis not present

## 2022-10-17 DIAGNOSIS — I5022 Chronic systolic (congestive) heart failure: Secondary | ICD-10-CM | POA: Diagnosis not present

## 2022-10-17 DIAGNOSIS — I2510 Atherosclerotic heart disease of native coronary artery without angina pectoris: Secondary | ICD-10-CM | POA: Diagnosis not present

## 2022-10-17 DIAGNOSIS — H25812 Combined forms of age-related cataract, left eye: Secondary | ICD-10-CM | POA: Diagnosis not present

## 2022-10-17 DIAGNOSIS — Z87891 Personal history of nicotine dependence: Secondary | ICD-10-CM | POA: Diagnosis not present

## 2022-10-17 DIAGNOSIS — Z79899 Other long term (current) drug therapy: Secondary | ICD-10-CM | POA: Diagnosis not present

## 2022-10-17 DIAGNOSIS — N184 Chronic kidney disease, stage 4 (severe): Secondary | ICD-10-CM | POA: Diagnosis not present

## 2022-10-17 DIAGNOSIS — Z7982 Long term (current) use of aspirin: Secondary | ICD-10-CM | POA: Diagnosis not present

## 2022-10-18 DIAGNOSIS — H25811 Combined forms of age-related cataract, right eye: Secondary | ICD-10-CM | POA: Diagnosis not present

## 2022-10-18 DIAGNOSIS — Z9842 Cataract extraction status, left eye: Secondary | ICD-10-CM | POA: Diagnosis not present

## 2022-10-18 DIAGNOSIS — Z961 Presence of intraocular lens: Secondary | ICD-10-CM | POA: Diagnosis not present

## 2022-10-18 DIAGNOSIS — H268 Other specified cataract: Secondary | ICD-10-CM | POA: Diagnosis not present

## 2022-10-23 DIAGNOSIS — E875 Hyperkalemia: Secondary | ICD-10-CM | POA: Diagnosis not present

## 2022-10-23 DIAGNOSIS — N1832 Chronic kidney disease, stage 3b: Secondary | ICD-10-CM | POA: Diagnosis not present

## 2022-10-25 DIAGNOSIS — H268 Other specified cataract: Secondary | ICD-10-CM | POA: Diagnosis not present

## 2022-10-25 DIAGNOSIS — H2513 Age-related nuclear cataract, bilateral: Secondary | ICD-10-CM | POA: Diagnosis not present

## 2022-10-25 DIAGNOSIS — Z9842 Cataract extraction status, left eye: Secondary | ICD-10-CM | POA: Diagnosis not present

## 2022-10-25 DIAGNOSIS — H25043 Posterior subcapsular polar age-related cataract, bilateral: Secondary | ICD-10-CM | POA: Diagnosis not present

## 2022-10-25 DIAGNOSIS — Z961 Presence of intraocular lens: Secondary | ICD-10-CM | POA: Diagnosis not present

## 2022-10-25 DIAGNOSIS — H25811 Combined forms of age-related cataract, right eye: Secondary | ICD-10-CM | POA: Diagnosis not present

## 2022-11-02 DIAGNOSIS — R000 Tachycardia, unspecified: Secondary | ICD-10-CM | POA: Diagnosis not present

## 2022-11-02 DIAGNOSIS — R197 Diarrhea, unspecified: Secondary | ICD-10-CM | POA: Diagnosis not present

## 2022-11-02 DIAGNOSIS — E861 Hypovolemia: Secondary | ICD-10-CM | POA: Diagnosis not present

## 2022-11-02 DIAGNOSIS — I2510 Atherosclerotic heart disease of native coronary artery without angina pectoris: Secondary | ICD-10-CM | POA: Diagnosis not present

## 2022-11-02 DIAGNOSIS — Z961 Presence of intraocular lens: Secondary | ICD-10-CM | POA: Diagnosis not present

## 2022-11-02 DIAGNOSIS — Z79899 Other long term (current) drug therapy: Secondary | ICD-10-CM | POA: Diagnosis not present

## 2022-11-02 DIAGNOSIS — Z9049 Acquired absence of other specified parts of digestive tract: Secondary | ICD-10-CM | POA: Diagnosis not present

## 2022-11-02 DIAGNOSIS — N184 Chronic kidney disease, stage 4 (severe): Secondary | ICD-10-CM | POA: Diagnosis not present

## 2022-11-02 DIAGNOSIS — I440 Atrioventricular block, first degree: Secondary | ICD-10-CM | POA: Diagnosis not present

## 2022-11-02 DIAGNOSIS — J449 Chronic obstructive pulmonary disease, unspecified: Secondary | ICD-10-CM | POA: Diagnosis not present

## 2022-11-02 DIAGNOSIS — I429 Cardiomyopathy, unspecified: Secondary | ICD-10-CM | POA: Diagnosis not present

## 2022-11-02 DIAGNOSIS — D649 Anemia, unspecified: Secondary | ICD-10-CM | POA: Diagnosis not present

## 2022-11-02 DIAGNOSIS — I499 Cardiac arrhythmia, unspecified: Secondary | ICD-10-CM | POA: Diagnosis not present

## 2022-11-02 DIAGNOSIS — I9589 Other hypotension: Secondary | ICD-10-CM | POA: Diagnosis not present

## 2022-11-02 DIAGNOSIS — F4312 Post-traumatic stress disorder, chronic: Secondary | ICD-10-CM | POA: Diagnosis not present

## 2022-11-02 DIAGNOSIS — G4733 Obstructive sleep apnea (adult) (pediatric): Secondary | ICD-10-CM | POA: Diagnosis not present

## 2022-11-02 DIAGNOSIS — H02834 Dermatochalasis of left upper eyelid: Secondary | ICD-10-CM | POA: Diagnosis not present

## 2022-11-02 DIAGNOSIS — I959 Hypotension, unspecified: Secondary | ICD-10-CM | POA: Diagnosis not present

## 2022-11-02 DIAGNOSIS — D751 Secondary polycythemia: Secondary | ICD-10-CM | POA: Diagnosis not present

## 2022-11-02 DIAGNOSIS — I5030 Unspecified diastolic (congestive) heart failure: Secondary | ICD-10-CM | POA: Diagnosis not present

## 2022-11-02 DIAGNOSIS — I5022 Chronic systolic (congestive) heart failure: Secondary | ICD-10-CM | POA: Diagnosis not present

## 2022-11-02 DIAGNOSIS — H524 Presbyopia: Secondary | ICD-10-CM | POA: Diagnosis not present

## 2022-11-02 DIAGNOSIS — I252 Old myocardial infarction: Secondary | ICD-10-CM | POA: Diagnosis not present

## 2022-11-02 DIAGNOSIS — Z7982 Long term (current) use of aspirin: Secondary | ICD-10-CM | POA: Diagnosis not present

## 2022-11-02 DIAGNOSIS — I517 Cardiomegaly: Secondary | ICD-10-CM | POA: Diagnosis not present

## 2022-11-02 DIAGNOSIS — Z539 Procedure and treatment not carried out, unspecified reason: Secondary | ICD-10-CM | POA: Diagnosis not present

## 2022-11-02 DIAGNOSIS — H02831 Dermatochalasis of right upper eyelid: Secondary | ICD-10-CM | POA: Diagnosis not present

## 2022-11-02 DIAGNOSIS — N183 Chronic kidney disease, stage 3 unspecified: Secondary | ICD-10-CM | POA: Diagnosis not present

## 2022-11-02 DIAGNOSIS — Z9981 Dependence on supplemental oxygen: Secondary | ICD-10-CM | POA: Diagnosis not present

## 2022-11-02 DIAGNOSIS — I509 Heart failure, unspecified: Secondary | ICD-10-CM | POA: Diagnosis not present

## 2022-11-02 DIAGNOSIS — I7143 Infrarenal abdominal aortic aneurysm, without rupture: Secondary | ICD-10-CM | POA: Diagnosis not present

## 2022-11-02 DIAGNOSIS — Z87891 Personal history of nicotine dependence: Secondary | ICD-10-CM | POA: Diagnosis not present

## 2022-11-02 DIAGNOSIS — T502X5A Adverse effect of carbonic-anhydrase inhibitors, benzothiadiazides and other diuretics, initial encounter: Secondary | ICD-10-CM | POA: Diagnosis not present

## 2022-11-02 DIAGNOSIS — H25813 Combined forms of age-related cataract, bilateral: Secondary | ICD-10-CM | POA: Diagnosis not present

## 2022-11-02 DIAGNOSIS — Z881 Allergy status to other antibiotic agents status: Secondary | ICD-10-CM | POA: Diagnosis not present

## 2022-11-02 DIAGNOSIS — I4719 Other supraventricular tachycardia: Secondary | ICD-10-CM | POA: Diagnosis not present

## 2022-11-02 DIAGNOSIS — J9611 Chronic respiratory failure with hypoxia: Secondary | ICD-10-CM | POA: Diagnosis not present

## 2022-11-05 DIAGNOSIS — E861 Hypovolemia: Secondary | ICD-10-CM | POA: Diagnosis not present

## 2022-11-08 DIAGNOSIS — J9611 Chronic respiratory failure with hypoxia: Secondary | ICD-10-CM | POA: Diagnosis not present

## 2022-11-08 DIAGNOSIS — J449 Chronic obstructive pulmonary disease, unspecified: Secondary | ICD-10-CM | POA: Diagnosis not present

## 2022-11-09 DIAGNOSIS — J449 Chronic obstructive pulmonary disease, unspecified: Secondary | ICD-10-CM | POA: Diagnosis not present

## 2022-11-13 DIAGNOSIS — I5022 Chronic systolic (congestive) heart failure: Secondary | ICD-10-CM | POA: Diagnosis not present

## 2022-11-13 DIAGNOSIS — J439 Emphysema, unspecified: Secondary | ICD-10-CM | POA: Diagnosis not present

## 2022-11-13 DIAGNOSIS — I7143 Infrarenal abdominal aortic aneurysm, without rupture: Secondary | ICD-10-CM | POA: Diagnosis not present

## 2022-11-13 DIAGNOSIS — I771 Stricture of artery: Secondary | ICD-10-CM | POA: Diagnosis not present

## 2022-11-13 DIAGNOSIS — I214 Non-ST elevation (NSTEMI) myocardial infarction: Secondary | ICD-10-CM | POA: Diagnosis not present

## 2022-11-13 DIAGNOSIS — R000 Tachycardia, unspecified: Secondary | ICD-10-CM | POA: Diagnosis not present

## 2022-11-13 DIAGNOSIS — N1832 Chronic kidney disease, stage 3b: Secondary | ICD-10-CM | POA: Diagnosis not present

## 2022-11-13 DIAGNOSIS — J449 Chronic obstructive pulmonary disease, unspecified: Secondary | ICD-10-CM | POA: Diagnosis not present

## 2022-11-13 DIAGNOSIS — E861 Hypovolemia: Secondary | ICD-10-CM | POA: Diagnosis not present

## 2022-11-24 DIAGNOSIS — R002 Palpitations: Secondary | ICD-10-CM | POA: Diagnosis not present

## 2022-11-24 DIAGNOSIS — I499 Cardiac arrhythmia, unspecified: Secondary | ICD-10-CM | POA: Diagnosis not present

## 2022-12-09 DIAGNOSIS — J449 Chronic obstructive pulmonary disease, unspecified: Secondary | ICD-10-CM | POA: Diagnosis not present

## 2022-12-09 DIAGNOSIS — J9611 Chronic respiratory failure with hypoxia: Secondary | ICD-10-CM | POA: Diagnosis not present

## 2022-12-10 DIAGNOSIS — J449 Chronic obstructive pulmonary disease, unspecified: Secondary | ICD-10-CM | POA: Diagnosis not present

## 2022-12-28 DIAGNOSIS — H25811 Combined forms of age-related cataract, right eye: Secondary | ICD-10-CM | POA: Diagnosis not present

## 2022-12-29 DIAGNOSIS — Z9842 Cataract extraction status, left eye: Secondary | ICD-10-CM | POA: Diagnosis not present

## 2022-12-29 DIAGNOSIS — Z9841 Cataract extraction status, right eye: Secondary | ICD-10-CM | POA: Diagnosis not present

## 2022-12-29 DIAGNOSIS — Z961 Presence of intraocular lens: Secondary | ICD-10-CM | POA: Diagnosis not present

## 2022-12-29 DIAGNOSIS — H268 Other specified cataract: Secondary | ICD-10-CM | POA: Diagnosis not present

## 2023-01-07 DIAGNOSIS — R110 Nausea: Secondary | ICD-10-CM | POA: Diagnosis not present

## 2023-01-07 DIAGNOSIS — R112 Nausea with vomiting, unspecified: Secondary | ICD-10-CM | POA: Diagnosis not present

## 2023-01-07 DIAGNOSIS — J439 Emphysema, unspecified: Secondary | ICD-10-CM | POA: Diagnosis not present

## 2023-01-07 DIAGNOSIS — E875 Hyperkalemia: Secondary | ICD-10-CM | POA: Diagnosis not present

## 2023-01-07 DIAGNOSIS — D72829 Elevated white blood cell count, unspecified: Secondary | ICD-10-CM | POA: Diagnosis not present

## 2023-01-07 DIAGNOSIS — G9341 Metabolic encephalopathy: Secondary | ICD-10-CM | POA: Diagnosis not present

## 2023-01-07 DIAGNOSIS — D631 Anemia in chronic kidney disease: Secondary | ICD-10-CM | POA: Diagnosis not present

## 2023-01-07 DIAGNOSIS — R195 Other fecal abnormalities: Secondary | ICD-10-CM | POA: Diagnosis not present

## 2023-01-07 DIAGNOSIS — N189 Chronic kidney disease, unspecified: Secondary | ICD-10-CM | POA: Diagnosis not present

## 2023-01-07 DIAGNOSIS — Z7982 Long term (current) use of aspirin: Secondary | ICD-10-CM | POA: Diagnosis not present

## 2023-01-07 DIAGNOSIS — J90 Pleural effusion, not elsewhere classified: Secondary | ICD-10-CM | POA: Diagnosis not present

## 2023-01-07 DIAGNOSIS — I10 Essential (primary) hypertension: Secondary | ICD-10-CM | POA: Diagnosis not present

## 2023-01-07 DIAGNOSIS — Z743 Need for continuous supervision: Secondary | ICD-10-CM | POA: Diagnosis not present

## 2023-01-07 DIAGNOSIS — R1111 Vomiting without nausea: Secondary | ICD-10-CM | POA: Diagnosis not present

## 2023-01-07 DIAGNOSIS — N281 Cyst of kidney, acquired: Secondary | ICD-10-CM | POA: Diagnosis not present

## 2023-01-07 DIAGNOSIS — R918 Other nonspecific abnormal finding of lung field: Secondary | ICD-10-CM | POA: Diagnosis not present

## 2023-01-07 DIAGNOSIS — R197 Diarrhea, unspecified: Secondary | ICD-10-CM | POA: Diagnosis not present

## 2023-01-07 DIAGNOSIS — I2510 Atherosclerotic heart disease of native coronary artery without angina pectoris: Secondary | ICD-10-CM | POA: Diagnosis not present

## 2023-01-07 DIAGNOSIS — N179 Acute kidney failure, unspecified: Secondary | ICD-10-CM | POA: Diagnosis not present

## 2023-01-07 DIAGNOSIS — K922 Gastrointestinal hemorrhage, unspecified: Secondary | ICD-10-CM | POA: Diagnosis not present

## 2023-01-08 DIAGNOSIS — G458 Other transient cerebral ischemic attacks and related syndromes: Secondary | ICD-10-CM | POA: Diagnosis not present

## 2023-01-08 DIAGNOSIS — J189 Pneumonia, unspecified organism: Secondary | ICD-10-CM | POA: Diagnosis not present

## 2023-01-08 DIAGNOSIS — K922 Gastrointestinal hemorrhage, unspecified: Secondary | ICD-10-CM | POA: Diagnosis not present

## 2023-01-08 DIAGNOSIS — R6521 Severe sepsis with septic shock: Secondary | ICD-10-CM | POA: Diagnosis not present

## 2023-01-08 DIAGNOSIS — J9611 Chronic respiratory failure with hypoxia: Secondary | ICD-10-CM | POA: Diagnosis not present

## 2023-01-08 DIAGNOSIS — I5020 Unspecified systolic (congestive) heart failure: Secondary | ICD-10-CM | POA: Diagnosis not present

## 2023-01-08 DIAGNOSIS — Z7982 Long term (current) use of aspirin: Secondary | ICD-10-CM | POA: Diagnosis not present

## 2023-01-08 DIAGNOSIS — J449 Chronic obstructive pulmonary disease, unspecified: Secondary | ICD-10-CM | POA: Diagnosis not present

## 2023-01-08 DIAGNOSIS — J90 Pleural effusion, not elsewhere classified: Secondary | ICD-10-CM | POA: Diagnosis not present

## 2023-01-08 DIAGNOSIS — D638 Anemia in other chronic diseases classified elsewhere: Secondary | ICD-10-CM | POA: Diagnosis not present

## 2023-01-08 DIAGNOSIS — I2510 Atherosclerotic heart disease of native coronary artery without angina pectoris: Secondary | ICD-10-CM | POA: Diagnosis not present

## 2023-01-08 DIAGNOSIS — I469 Cardiac arrest, cause unspecified: Secondary | ICD-10-CM | POA: Diagnosis not present

## 2023-01-08 DIAGNOSIS — F0390 Unspecified dementia without behavioral disturbance: Secondary | ICD-10-CM | POA: Diagnosis not present

## 2023-01-08 DIAGNOSIS — R195 Other fecal abnormalities: Secondary | ICD-10-CM | POA: Diagnosis not present

## 2023-01-08 DIAGNOSIS — N183 Chronic kidney disease, stage 3 unspecified: Secondary | ICD-10-CM | POA: Diagnosis not present

## 2023-01-08 DIAGNOSIS — D72829 Elevated white blood cell count, unspecified: Secondary | ICD-10-CM | POA: Diagnosis not present

## 2023-01-08 DIAGNOSIS — N189 Chronic kidney disease, unspecified: Secondary | ICD-10-CM | POA: Diagnosis not present

## 2023-01-08 DIAGNOSIS — Z9981 Dependence on supplemental oxygen: Secondary | ICD-10-CM | POA: Diagnosis not present

## 2023-01-08 DIAGNOSIS — Z66 Do not resuscitate: Secondary | ICD-10-CM | POA: Diagnosis not present

## 2023-01-08 DIAGNOSIS — I509 Heart failure, unspecified: Secondary | ICD-10-CM | POA: Diagnosis not present

## 2023-01-08 DIAGNOSIS — R918 Other nonspecific abnormal finding of lung field: Secondary | ICD-10-CM | POA: Diagnosis not present

## 2023-01-08 DIAGNOSIS — E875 Hyperkalemia: Secondary | ICD-10-CM | POA: Diagnosis not present

## 2023-01-08 DIAGNOSIS — N179 Acute kidney failure, unspecified: Secondary | ICD-10-CM | POA: Diagnosis not present

## 2023-01-08 DIAGNOSIS — R5381 Other malaise: Secondary | ICD-10-CM | POA: Diagnosis not present

## 2023-01-08 DIAGNOSIS — N281 Cyst of kidney, acquired: Secondary | ICD-10-CM | POA: Diagnosis not present

## 2023-01-08 DIAGNOSIS — G9341 Metabolic encephalopathy: Secondary | ICD-10-CM | POA: Diagnosis not present

## 2023-01-08 DIAGNOSIS — J439 Emphysema, unspecified: Secondary | ICD-10-CM | POA: Diagnosis not present

## 2023-01-08 DIAGNOSIS — A419 Sepsis, unspecified organism: Secondary | ICD-10-CM | POA: Diagnosis not present

## 2023-01-08 DIAGNOSIS — R531 Weakness: Secondary | ICD-10-CM | POA: Diagnosis not present

## 2023-01-09 DIAGNOSIS — J449 Chronic obstructive pulmonary disease, unspecified: Secondary | ICD-10-CM | POA: Diagnosis not present

## 2023-01-26 DEATH — deceased
